# Patient Record
Sex: Female | Born: 1975
Health system: Southern US, Community
[De-identification: ages and names within clinical notes are randomized; demographics above are authoritative.]

## PROBLEM LIST (undated history)

## (undated) DIAGNOSIS — K2970 Gastritis, unspecified, without bleeding: Secondary | ICD-10-CM

## (undated) DIAGNOSIS — Z8619 Personal history of other infectious and parasitic diseases: Secondary | ICD-10-CM

## (undated) DIAGNOSIS — K219 Gastro-esophageal reflux disease without esophagitis: Secondary | ICD-10-CM

## (undated) DIAGNOSIS — B019 Varicella without complication: Secondary | ICD-10-CM

## (undated) DIAGNOSIS — E0590 Thyrotoxicosis, unspecified without thyrotoxic crisis or storm: Secondary | ICD-10-CM

## (undated) DIAGNOSIS — T7840XA Allergy, unspecified, initial encounter: Secondary | ICD-10-CM

## (undated) HISTORY — DX: Personal history of other infectious and parasitic diseases: Z86.19

## (undated) HISTORY — DX: Gastro-esophageal reflux disease without esophagitis: K21.9

## (undated) HISTORY — DX: Allergy, unspecified, initial encounter: T78.40XA

## (undated) HISTORY — DX: Varicella without complication: B01.9

## (undated) HISTORY — DX: Gastritis, unspecified, without bleeding: K29.70

## (undated) HISTORY — PX: COLONOSCOPY: SHX174

## (undated) HISTORY — DX: Thyrotoxicosis, unspecified without thyrotoxic crisis or storm: E05.90

## (undated) HISTORY — PX: UPPER GASTROINTESTINAL ENDOSCOPY: SHX188

---

## 2013-06-30 ENCOUNTER — Encounter: Payer: Self-pay | Admitting: Physician Assistant

## 2013-06-30 ENCOUNTER — Ambulatory Visit (INDEPENDENT_AMBULATORY_CARE_PROVIDER_SITE_OTHER): Payer: BC Managed Care – PPO | Admitting: Physician Assistant

## 2013-06-30 VITALS — BP 112/78 | HR 82 | Temp 98.4°F | Resp 14 | Ht 64.0 in | Wt 156.2 lb

## 2013-06-30 DIAGNOSIS — J209 Acute bronchitis, unspecified: Secondary | ICD-10-CM

## 2013-06-30 DIAGNOSIS — E0590 Thyrotoxicosis, unspecified without thyrotoxic crisis or storm: Secondary | ICD-10-CM

## 2013-06-30 MED ORDER — AZITHROMYCIN 250 MG PO TABS: ORAL_TABLET | ORAL | Status: DC

## 2013-06-30 NOTE — Progress Notes (Signed)
Patient ID: Kristina Powell, female   DOB: 17-Feb-1976, 37 y.o.   MRN: [id number]  Patient presents to clinic today to establish care.  Acute Concerns:   Patient c/o cough productive of clear-yellow sputum, chest congestion, subjective fevers, headache that have been present over the past week or so.  Patient endorses cousin with similar symptoms recently.  Denies sinus pressure or pain, but notes she did have significant nasal congestion and sore throat earlier in the week.  Took some OTC pain relievers with some relief of sore throat.  Has history of seasonal allergies.  Denies history of asthma.  Some mild shortness of breath after coughing spells.  Denies wheezing.  Denies rash, N/V/C/D.  Chronic Issues: (1) Hyperthyroidism -- Patient reports history of hyperthyroidism after giving birth to child 2 years ago.  Was told she has Grave's disease.  Was followed by endocrinologist in [place] before moving to [place].  Was followed by endocrinologist at [facility], but will not go back to see them due to 1000.00 fee for labs at last visit.  She feels her symptoms are gone.  Takes Methimazole 5 mg once a day, although she is prescribed medication 3 times a day.  Denies heart palpitations, diarrhea, weight loss, nervousness/anxiety, exophthalmos. Wants to know hoe much labs will cost.  Health Maintenance: Dental -- up-to-date Vision -- 2013.  States she needs to schedule an appointment Immunizations -- Up-to-date.  Declines flu shot due to bad experience in the past. Mammogram -- March 2014.  No abnormal findings. PAP -- reports up-to-date  Will attempt to obtain records from previous PCP and Endocrinologist.   Past Medical History  Diagnosis Date  . Hyperthyroidism     x2 years, post childbirth  . Chicken pox     No current outpatient prescriptions on file prior to visit.   No current facility-administered medications on file prior to visit.    No Known Allergies  Family History  Problem Relation  Age of Onset  . Hyperlipidemia Mother   . Diabetes Maternal Grandmother   . Thyroid disease Mother   . Skin cancer Mother   . Healthy Father   . Colon cancer Maternal Grandfather   . Thyroid disease Sister     History   Social History  . Marital Status: Married    Spouse Name: N/A    Number of Children: N/A  . Years of Education: N/A   Social History Main Topics  . Smoking status: Never Smoker   . Smokeless tobacco: Never Used  . Alcohol Use: No  . Drug Use: No  . Sexual Activity: Yes   Other Topics Concern  . None   Social History Narrative  . None   Review of Systems  Constitutional: Negative for fever, chills and weight loss.  HENT: Negative for hearing loss, ear pain, tinnitus and ear discharge.   Eyes: Negative for blurred vision, double vision, photophobia and pain.  Respiratory: Positive for cough, sputum production, shortness of breath and wheezing.   Cardiovascular: Negative for chest pain and palpitations.  Gastrointestinal: Negative for heartburn, nausea, vomiting, abdominal pain, diarrhea, constipation, blood in stool and melena.  Genitourinary: Negative for dysuria, urgency, frequency, hematuria and flank pain.  Musculoskeletal: Positive for myalgias.  Neurological: Negative for seizures, loss of consciousness and headaches.  Endo/Heme/Allergies: Positive for environmental allergies.  Psychiatric/Behavioral: Negative for depression, suicidal ideas, hallucinations and substance abuse. The patient is nervous/anxious. The patient does not have insomnia.    Filed Vitals:   06/30/13 1351  BP:  112/78  Pulse: 82  Temp: 98.4 F (36.9 C)  Resp: 14    Physical Exam  Vitals reviewed. Constitutional: She is oriented to person, place, and time and well-developed, well-nourished, and in no distress.  HENT:  Head: Normocephalic and atraumatic.  Right Ear: External ear normal.  Left Ear: External ear normal.  Nose: Nose normal.  Mouth/Throat: Oropharynx is  clear and moist. No oropharyngeal exudate.  TM WNL bilaterally.  No tenderness to percussion over sinuses.  Eyes: Conjunctivae and EOM are normal. Pupils are equal, round, and reactive to light.  Neck: Normal range of motion. Neck supple.  Cardiovascular: Normal rate, regular rhythm, normal heart sounds and intact distal pulses.   Pulmonary/Chest: Breath sounds normal. No respiratory distress. She has no wheezes. She has no rales. She exhibits no tenderness.  Abdominal: Soft. Bowel sounds are normal. She exhibits no distension and no mass. There is no tenderness. There is no rebound and no guarding.  Lymphadenopathy:    She has no cervical adenopathy.  Neurological: She is alert and oriented to person, place, and time.  Skin: Skin is warm and dry. No rash noted.   Assessment/Plan: Acute bronchitis Rx Azithromycin.  Increase fluids.  Rest.  Saline nasal spray.  Daily probiotic. Warm liquids for throat.  Call or return to clinic if symptoms do not improve.   Hyperthyroidism Will obtain TSH, free T4.  Obtain labs from prior endocrinologist and prior PCP.  Will possibly give referral to endocrinologist

## 2013-06-30 NOTE — Patient Instructions (Signed)
Please take antibiotics as prescribed.  Increase fluid intake.  Rest.  Daily probiotic.  Saline nasal spray.  Daily claritin.  Bronchitis Bronchitis is the body's way of reacting to injury and/or infection (inflammation) of the bronchi. Bronchi are the air tubes that extend from the windpipe into the lungs. If the inflammation becomes severe, it may cause shortness of breath. CAUSES  Inflammation may be caused by:  A virus.  Germs (bacteria).  Dust.  Allergens.  Pollutants and many other irritants. The cells lining the bronchial tree are covered with tiny hairs (cilia). These constantly beat upward, away from the lungs, toward the mouth. This keeps the lungs free of pollutants. When these cells become too irritated and are unable to do their job, mucus begins to develop. This causes the characteristic cough of bronchitis. The cough clears the lungs when the cilia are unable to do their job. Without either of these protective mechanisms, the mucus would settle in the lungs. Then you would develop pneumonia. Smoking is a common cause of bronchitis and can contribute to pneumonia. Stopping this habit is the single most important thing you can do to help yourself. TREATMENT   Your caregiver may prescribe an antibiotic if the cough is caused by bacteria. Also, medicines that open up your airways make it easier to breathe. Your caregiver may also recommend or prescribe an expectorant. It will loosen the mucus to be coughed up. Only take over-the-counter or prescription medicines for pain, discomfort, or fever as directed by your caregiver.  Removing whatever causes the problem (smoking, for example) is critical to preventing the problem from getting worse.  Cough suppressants may be prescribed for relief of cough symptoms.  Inhaled medicines may be prescribed to help with symptoms now and to help prevent problems from returning.  For those with recurrent (chronic) bronchitis, there may be a  need for steroid medicines. SEEK IMMEDIATE MEDICAL CARE IF:   During treatment, you develop more pus-like mucus (purulent sputum).  You have a fever.  Your baby is older than 3 months with a rectal temperature of 102 F (38.9 C) or higher.  Your baby is 40 months old or younger with a rectal temperature of 100.4 F (38 C) or higher.  You become progressively more ill.  You have increased difficulty breathing, wheezing, or shortness of breath. It is necessary to seek immediate medical care if you are elderly or sick from any other disease. MAKE SURE YOU:   Understand these instructions.  Will watch your condition.  Will get help right away if you are not doing well or get worse. Document Released: 09/16/2005 Document Revised: 12/09/2011 Document Reviewed: 07/26/2008 [facility] Patient Information 2014 [place], [place].

## 2013-06-30 NOTE — Assessment & Plan Note (Signed)
Will obtain TSH, free T4.  Obtain labs from prior endocrinologist and prior PCP.  Will possibly give referral to endocrinologist

## 2013-06-30 NOTE — Assessment & Plan Note (Signed)
Rx Azithromycin.  Increase fluids.  Rest.  Saline nasal spray.  Daily probiotic. Warm liquids for throat.  Call or return to clinic if symptoms do not improve.

## 2013-07-01 ENCOUNTER — Telehealth: Payer: Self-pay | Admitting: Physician Assistant

## 2013-07-01 ENCOUNTER — Encounter: Payer: Self-pay | Admitting: Physician Assistant

## 2013-07-01 NOTE — Telephone Encounter (Signed)
Letter written, printed and ready to be sent or picked up. 

## 2013-07-01 NOTE — Telephone Encounter (Signed)
Patient states that she was in the office yesterday and was given a note to go back to work today but she says that she is still not feeling well and called into work today. She would like a work note for today also.

## 2013-07-01 NOTE — Telephone Encounter (Signed)
Patient informed; she will p/u letter tomorrow morning at front desk after 8:00a/SLS

## 2013-07-14 ENCOUNTER — Ambulatory Visit (INDEPENDENT_AMBULATORY_CARE_PROVIDER_SITE_OTHER): Payer: BC Managed Care – PPO | Admitting: Physician Assistant

## 2013-07-14 ENCOUNTER — Encounter: Payer: Self-pay | Admitting: Physician Assistant

## 2013-07-14 VITALS — BP 110/78 | HR 78 | Temp 98.1°F | Resp 16 | Ht 64.0 in | Wt 152.0 lb

## 2013-07-14 DIAGNOSIS — R058 Other specified cough: Secondary | ICD-10-CM

## 2013-07-14 DIAGNOSIS — R059 Cough, unspecified: Secondary | ICD-10-CM

## 2013-07-14 DIAGNOSIS — R05 Cough: Secondary | ICD-10-CM

## 2013-07-14 NOTE — Progress Notes (Signed)
Patient ID: Kristina Powell, female   DOB: 1976/07/31, 37 y.o.   MRN: [id number]  Patient presents to clinic today c/o itchy, watery eyes, and dry cough noticed over the past week.  Patient was seen a couple of weeks ago and diagnosed with acute bronchitis.  States that since then cough has become non-productive.  Denies fever, chills, fatigue.  States she feels much better than at last visit.  Thinks she may have allergies.  Denies history of allergy before moving to [place].  States she took daily zyrtec per my directions while having bronchitis and noted a decrease in these potentially "allergy-related" symptoms.  Patient otherwise doing well today.   Past Medical History  Diagnosis Date  . Hyperthyroidism     x2 years, post childbirth  . Chicken pox     Current Outpatient Prescriptions on File Prior to Visit  Medication Sig Dispense Refill  . methimazole (TAPAZOLE) 5 MG tablet Take 5 mg by mouth 3 (three) times daily. PATIENT CURRENTLY ONLY TASKING ONCE DAILY D/T NOT HAVING LABS DRAWN AS OF YET.      . Multiple Vitamin (MULTIVITAMIN) tablet Take 1 tablet by mouth daily.       No current facility-administered medications on file prior to visit.    No Known Allergies  Family History  Problem Relation Age of Onset  . Hyperlipidemia Mother   . Diabetes Maternal Grandmother   . Thyroid disease Mother   . Skin cancer Mother   . Healthy Father   . Colon cancer Maternal Grandfather   . Thyroid disease Sister     History   Social History  . Marital Status: Married    Spouse Name: N/A    Number of Children: N/A  . Years of Education: N/A   Social History Main Topics  . Smoking status: Never Smoker   . Smokeless tobacco: Never Used  . Alcohol Use: No  . Drug Use: No  . Sexual Activity: Yes   Other Topics Concern  . None   Social History Narrative  . None   ROS See HPI.  All other ROS are negative.   Filed Vitals:   07/14/13 1325  BP: 110/78  Pulse: 78  Temp: 98.1  F (36.7 C)  Resp: 16   Physical Exam  Vitals reviewed. Constitutional: She is oriented to person, place, and time and well-developed, well-nourished, and in no distress.  HENT:  Head: Normocephalic and atraumatic.  Right Ear: External ear normal.  Left Ear: External ear normal.  Nose: Nose normal.  Mouth/Throat: Oropharynx is clear and moist. No oropharyngeal exudate.  TM WNL bilaterally.   Eyes: Conjunctivae are normal. Pupils are equal, round, and reactive to light.  Watery drainage noted from eyes bilaterally.  Allergic shiners noted as well.  Neck: Neck supple.  Cardiovascular: Normal rate, regular rhythm, normal heart sounds and intact distal pulses.   Pulmonary/Chest: Effort normal and breath sounds normal. No respiratory distress. She has no wheezes. She has no rales. She exhibits no tenderness.  Lymphadenopathy:    She has no cervical adenopathy.  Neurological: She is alert and oriented to person, place, and time.  Skin: Skin is warm and dry. No rash noted.  Psychiatric: Affect normal.   Assessment/Plan: Allergic cough Daily zyrtec.  Saline nasal spray.  Most likely cough is combination of allergies and healing airways from acute bronchitis.  Return to clinic if cough persists or new symptoms develop.

## 2013-07-14 NOTE — Assessment & Plan Note (Signed)
Daily zyrtec.  Saline nasal spray.  Most likely cough is combination of allergies and healing airways from acute bronchitis.  Return to clinic if cough persists or new symptoms develop.

## 2013-07-14 NOTE — Patient Instructions (Signed)
Increase fluids.  Daily probiotic.  Daily Claritin.  Visine Allergy Eye drops.  Honey or over the counter cough syrup for cough.   Allergies, Generic Allergies may happen from anything your body is sensitive to. This may be food, medicines, pollens, chemicals, and nearly anything around you in everyday life that produces allergens. An allergen is anything that causes an allergy producing substance. Heredity is often a factor in causing these problems. This means you may have some of the same allergies as your parents.  Seasonal allergies occur in all age groups. These are seasonal because they usually occur during the same season every year. They may be a reaction to molds, grass pollens, or tree pollens. Other causes of problems are house dust mite allergens, pet dander, and mold spores. The symptoms often consist of nasal congestion, a runny itchy nose associated with sneezing, and tearing itchy eyes. There is often an associated itching of the mouth and ears. The problems happen when you come in contact with pollens and other allergens. Allergens are the particles in the air that the body reacts to with an allergic reaction. This causes you to release allergic antibodies. Through a chain of events, these eventually cause you to release histamine into the blood stream. Although it is meant to be protective to the body, it is this release that causes your discomfort. This is why you were given anti-histamines to feel better. If you are unable to pinpoint the offending allergen, it may be determined by skin or blood testing. Allergies cannot be cured but can be controlled with medicine.  Hay fever is a collection of all or some of the seasonal allergy problems. It may often be treated with simple over-the-counter medicine such as diphenhydramine. Take medicine as directed. Do not drink alcohol or drive while taking this medicine. Check with your caregiver or package insert for child dosages. If these  medicines are not effective, there are many new medicines your caregiver can prescribe. Stronger medicine such as nasal spray, eye drops, and corticosteroids may be used if the first things you try do not work well. Other treatments such as immunotherapy or desensitizing injections can be used if all else fails. Follow up with your caregiver if problems continue. These seasonal allergies are usually not life threatening. They are generally more of a nuisance that can often be handled using medicine. HOME CARE INSTRUCTIONS   If unsure what causes a reaction, keep a diary of foods eaten and symptoms that follow. Avoid foods that cause reactions.  If hives or rash are present:  Take medicine as directed.  You may use an over-the-counter antihistamine (diphenhydramine) for hives and itching as needed.  Apply cold compresses (cloths) to the skin or take baths in cool water. Avoid hot baths or showers. Heat will make a rash and itching worse.  If you are severely allergic:  Following a treatment for a severe reaction, hospitalization is often required for closer follow-up.  Wear a medic-alert bracelet or necklace stating the allergy.  You and your family must learn how to give adrenaline or use an anaphylaxis kit.  If you have had a severe reaction, always carry your anaphylaxis kit or EpiPen with you. Use this medicine as directed by your caregiver if a severe reaction is occurring. Failure to do so could have a fatal outcome. SEEK MEDICAL CARE IF:  You suspect a food allergy. Symptoms generally happen within 30 minutes of eating a food.  Your symptoms have not gone away within  2 days or are getting worse.  You develop new symptoms.  You want to retest yourself or your child with a food or drink you think causes an allergic reaction. Never do this if an anaphylactic reaction to that food or drink has happened before. Only do this under the care of a caregiver. SEEK IMMEDIATE MEDICAL CARE  IF:   You have difficulty breathing, are wheezing, or have a tight feeling in your chest or throat.  You have a swollen mouth, or you have hives, swelling, or itching all over your body.  You have had a severe reaction that has responded to your anaphylaxis kit or an EpiPen. These reactions may return when the medicine has worn off. These reactions should be considered life threatening. MAKE SURE YOU:   Understand these instructions.  Will watch your condition.  Will get help right away if you are not doing well or get worse. Document Released: 12/10/2002 Document Revised: 12/09/2011 Document Reviewed: 05/16/2008 [facility] Patient Information 2014 [place], [place].

## 2013-07-18 LAB — TSH: TSH: 1.075 u[IU]/mL (ref 0.350–4.500)

## 2013-07-21 ENCOUNTER — Telehealth: Payer: Self-pay | Admitting: *Deleted

## 2013-07-21 DIAGNOSIS — E0590 Thyrotoxicosis, unspecified without thyrotoxic crisis or storm: Secondary | ICD-10-CM

## 2013-07-21 NOTE — Telephone Encounter (Signed)
Patient informed, understood & agreed; request to have labs done at [facility] [on a Saturday] on [address] [place]; faxed orders/SLS

## 2013-07-21 NOTE — Telephone Encounter (Signed)
Message copied by Regis Bill on Wed Jul 21, 2013  3:22 PM ------      Message from: Marcelline Mates      Created: Wed Jul 21, 2013  7:46 AM       Please inform patient that her Thyroid levels are in normal limits.  What I would like to do is for her to stop her methimazole completely for the next 2-3 weeks.  I want her to come back in that time for [place] to repeat her thyroid levels to make sure they stay normal after stopping treatment and for [place] to discuss her thyroid condition. ------

## 2013-07-28 ENCOUNTER — Telehealth: Payer: Self-pay | Admitting: Physician Assistant

## 2013-07-28 DIAGNOSIS — J392 Other diseases of pharynx: Secondary | ICD-10-CM

## 2013-07-28 NOTE — Telephone Encounter (Signed)
Patient informed, understood & agreed; pt erased VM for appt, gave her all information on appointment for [facility] ENT w/Dr. Kathleen Lime

## 2013-07-28 NOTE — Telephone Encounter (Signed)
Patient states that she is still having problems with her throat. She says that she has been in twice for this and would like to be referred out. She says that she is not having any of the other symptoms as before.

## 2013-07-28 NOTE — Telephone Encounter (Signed)
Please Advise

## 2013-07-28 NOTE — Telephone Encounter (Signed)
Referral to ENT has been made.  Patient will be contacted by ENT office for further evaluation and treatment.

## 2013-08-28 LAB — TSH: TSH: 0.682 u[IU]/mL (ref 0.350–4.500)

## 2013-08-28 LAB — T4, FREE: Free T4: 1.41 ng/dL (ref 0.80–1.80)

## 2013-10-20 ENCOUNTER — Telehealth: Payer: Self-pay | Admitting: *Deleted

## 2013-10-20 DIAGNOSIS — E0590 Thyrotoxicosis, unspecified without thyrotoxic crisis or storm: Secondary | ICD-10-CM

## 2013-10-20 NOTE — Telephone Encounter (Signed)
Pt left message on 10/19/13 that she was supposed to return to the lab for TSH but the lab didn't have an order. Left message for pt to return my call.  Did pt already have blood drawn? Will she be coming to our office for labs?

## 2013-10-20 NOTE — Telephone Encounter (Signed)
Spoke with pt and verified that [facility] [address] Saturday for her follow up TSH. Order entered and faxed to [phone].

## 2013-11-06 LAB — TSH: TSH: 0.438 u[IU]/mL (ref 0.350–4.500)

## 2014-08-03 ENCOUNTER — Encounter: Payer: Self-pay | Admitting: Physician Assistant

## 2014-08-03 ENCOUNTER — Ambulatory Visit (INDEPENDENT_AMBULATORY_CARE_PROVIDER_SITE_OTHER): Payer: BC Managed Care – PPO | Admitting: Physician Assistant

## 2014-08-03 ENCOUNTER — Ambulatory Visit (HOSPITAL_BASED_OUTPATIENT_CLINIC_OR_DEPARTMENT_OTHER)
Admission: RE | Admit: 2014-08-03 | Discharge: 2014-08-03 | Disposition: A | Discharge status: Home / Self Care | Payer: BC Managed Care – PPO | Source: Ambulatory Visit | Attending: Physician Assistant | Admitting: Physician Assistant

## 2014-08-03 VITALS — BP 118/80 | HR 96 | Temp 98.3°F | Wt 151.0 lb

## 2014-08-03 DIAGNOSIS — M2660 Temporomandibular joint disorder, unspecified: Secondary | ICD-10-CM | POA: Insufficient documentation

## 2014-08-03 DIAGNOSIS — M26609 Unspecified temporomandibular joint disorder, unspecified side: Secondary | ICD-10-CM

## 2014-08-03 DIAGNOSIS — E0590 Thyrotoxicosis, unspecified without thyrotoxic crisis or storm: Secondary | ICD-10-CM

## 2014-08-03 MED ORDER — MELOXICAM 15 MG PO TABS: 15.0000 mg | ORAL_TABLET | Freq: Every day | ORAL | Status: DC

## 2014-08-03 NOTE — Patient Instructions (Signed)
Please take the Mobic daily for the TMJ pain.  Apply topical Aspercreme to the area.  Go downstairs for x-ray so we can further assess your symptoms.  I will call you with your results.  Read information below on TMJ dysfunction.  We will recheck your thyroid function today as well.  Stop by the lab for this.  Follow-up will be determined based on your results.  Temporomandibular Problems  Temporomandibular joint (TMJ) dysfunction means there are problems with the joint between your jaw and your skull. This is a joint lined by cartilage like other joints in your body but also has a small disc in the joint which keeps the bones from rubbing on each other. These joints are like other joints and can get inflamed (sore) from arthritis and other problems. When this joint gets sore, it can cause headaches and pain in the jaw and the face. CAUSES  Usually the arthritic types of problems are caused by soreness in the joint. Soreness in the joint can also be caused by overuse. This may come from grinding your teeth. It may also come from mis-alignment in the joint. DIAGNOSIS Diagnosis of this condition can often be made by history and exam. Sometimes your caregiver may need X-rays or an MRI scan to determine the exact cause. It may be necessary to see your dentist to determine if your teeth and jaws are lined up correctly. TREATMENT  Most of the time this problem is not serious; however, sometimes it can persist (become chronic). When this happens medications that will cut down on inflammation (soreness) help. Sometimes a shot of cortisone into the joint will be helpful. If your teeth are not aligned it may help for your dentist to make a splint for your mouth that can help this problem. If no physical problems can be found, the problem may come from tension. If tension is found to be the cause, biofeedback or relaxation techniques may be helpful. HOME CARE INSTRUCTIONS   Later in the day, applications of ice  packs may be helpful. Ice can be used in a plastic bag with a towel around it to prevent frostbite to skin. This may be used about every 2 hours for 20 to 30 minutes, as needed while awake, or as directed by your caregiver.  Only take over-the-counter or prescription medicines for pain, discomfort, or fever as directed by your caregiver.  If physical therapy was prescribed, follow your caregiver's directions.  Wear mouth appliances as directed if they were given. Document Released: 06/11/2001 Document Revised: 12/09/2011 Document Reviewed: 09/18/2008 [facility] Patient Information 2015 [place], [place]. This information is not intended to replace advice given to you by your health care provider. Make sure you discuss any questions you have with your health care provider.

## 2014-08-03 NOTE — Assessment & Plan Note (Signed)
Rx Mobic.  Ice.  Topical Aspercreme.  Will proceed with plain film of TMJ joints bilaterally.

## 2014-08-03 NOTE — Progress Notes (Signed)
Pre visit review using our clinic review tool, if applicable. No additional management support is needed unless otherwise documented below in the visit note. 

## 2014-08-03 NOTE — Assessment & Plan Note (Signed)
Will recheck thyroid function.  If stable, will recheck annually.

## 2014-08-03 NOTE — Progress Notes (Signed)
   Patient presents to clinic today c/o R Pain in anterior ear and jaw x 4 weeks.  Was seen by an ENT recently who diagnosed her with TMJ.  Was told to take Ibuprofen.  Denies good relief of symptoms with this medication.  Endorses continues pain with ROM of the mouth.  Denies fever, chills.  Patient wishes to have her TSH checked to document continued stability off of methimazole.  Past Medical History  Diagnosis Date  . Hyperthyroidism     x2 years, post childbirth  . Chicken pox     Current Outpatient Prescriptions on File Prior to Visit  Medication Sig Dispense Refill  . Multiple Vitamin (MULTIVITAMIN) tablet Take 1 tablet by mouth daily.     No current facility-administered medications on file prior to visit.    No Known Allergies  Family History  Problem Relation Age of Onset  . Hyperlipidemia Mother   . Diabetes Maternal Grandmother   . Thyroid disease Mother   . Skin cancer Mother   . Healthy Father   . Colon cancer Maternal Grandfather   . Thyroid disease Sister     History   Social History  . Marital Status: Married    Spouse Name: N/A    Number of Children: N/A  . Years of Education: N/A   Social History Main Topics  . Smoking status: Never Smoker   . Smokeless tobacco: Never Used  . Alcohol Use: No  . Drug Use: No  . Sexual Activity: Yes   Other Topics Concern  . None   Social History Narrative   Review of Systems - See HPI.  All other ROS are negative.  BP 118/80 mmHg  Pulse 96  Temp(Src) 98.3 F (36.8 C)  Wt 151 lb (68.493 kg)  SpO2 98%  Physical Exam  Constitutional: She is oriented to person, place, and time and well-developed, well-nourished, and in no distress.  HENT:  Head: Normocephalic and atraumatic.  Right Ear: Tympanic membrane, external ear and ear canal normal.  Left Ear: Tympanic membrane, external ear and ear canal normal.  Nose: Nose normal.  Mouth/Throat: Uvula is midline, oropharynx is clear and moist and mucous  membranes are normal.  Pain with movement of R TMJ noted on examination.  Eyes: Conjunctivae are normal. Pupils are equal, round, and reactive to light.  Neck: Neck supple.  Cardiovascular: Normal rate, regular rhythm, normal heart sounds and intact distal pulses.   Pulmonary/Chest: Effort normal and breath sounds normal. No respiratory distress. She has no wheezes. She has no rales. She exhibits no tenderness.  Lymphadenopathy:    She has no cervical adenopathy.  Neurological: She is alert and oriented to person, place, and time.  Skin: Skin is warm and dry. No rash noted.  Psychiatric: Affect normal.  Vitals reviewed.  Assessment/Plan: Hyperthyroidism Will recheck thyroid function.  If stable, will recheck annually.  TMJ dysfunction Rx Mobic.  Ice.  Topical Aspercreme.  Will proceed with plain film of TMJ joints bilaterally.

## 2014-08-04 LAB — TSH: TSH: 0.12 u[IU]/mL — ABNORMAL LOW (ref 0.35–4.50)

## 2014-08-04 LAB — T4, FREE: Free T4: 1.95 ng/dL — ABNORMAL HIGH (ref 0.60–1.60)

## 2014-08-09 ENCOUNTER — Telehealth: Payer: Self-pay | Admitting: Physician Assistant

## 2014-08-09 DIAGNOSIS — E0590 Thyrotoxicosis, unspecified without thyrotoxic crisis or storm: Secondary | ICD-10-CM

## 2014-08-09 NOTE — Telephone Encounter (Signed)
Referral has been placed. 

## 2014-08-09 NOTE — Telephone Encounter (Signed)
-----   Message from Eustace Quailavid J Reabold, RMA sent at 08/09/2014  3:23 PM EST ----- Pt verbalized understanding . Pt would like to see a specialist in [place].

## 2014-08-19 ENCOUNTER — Ambulatory Visit (INDEPENDENT_AMBULATORY_CARE_PROVIDER_SITE_OTHER): Payer: BC Managed Care – PPO | Admitting: Internal Medicine

## 2014-08-19 ENCOUNTER — Encounter: Payer: Self-pay | Admitting: Internal Medicine

## 2014-08-19 VITALS — BP 100/64 | HR 91 | Temp 97.7°F | Resp 12 | Ht 63.75 in | Wt 151.8 lb

## 2014-08-19 DIAGNOSIS — E0500 Thyrotoxicosis with diffuse goiter without thyrotoxic crisis or storm: Secondary | ICD-10-CM

## 2014-08-19 MED ORDER — METHIMAZOLE 5 MG PO TABS: 5.0000 mg | ORAL_TABLET | Freq: Two times a day (BID) | ORAL | Status: DC

## 2014-08-19 NOTE — Progress Notes (Signed)
Patient ID: Kristina Powell Amey, female   DOB: 09/03/1976, 38 y.o.   MRN: [id number]   HPI  Kristina Powell Cain is a 38 y.o.-year-old female, referred by her PCP, Piedad ClimesMartin, William Cody, PA-C, for evaluation for thyrotoxycosis, and h/o Graves ds.  Pt was dx with hyperthyroidism in 2005 >> developed this after she had her child >> saw PCP in [place], where she lived at the time >> dx with Graves ds. (unclear if she had an uptake and scan then, but likely) >> tx with MMI from 2012-2014 >> stopped 06/2013.   I reviewed pt's thyroid tests: Lab Results  Component Value Date   TSH 0.12* 08/03/2014   TSH 0.438 11/06/2013   TSH 0.682 08/28/2013   TSH 1.075 07/17/2013   FREET4 1.95* 08/03/2014   FREET4 1.41 08/28/2013   FREET4 1.47 07/17/2013  11/24/2013: TSH 0.86, fT4 1.4, TSI 68 (<140%) 07/02/2012: TSH 3.18, TT4 9.4 (4.5-12) 04/14/2012: TSH 1.81  She restarted MMI this week after last results returned >> 10 mg daily. She became constipated after this. Still not sleeping well. She is afraid to come off MMI.  Pt denies feeling nodules in neck, hoarseness, dysphagia/odynophagia, SOB with lying down; she c/o: - + excessive sweating/+ heat intolerance - + fatigue - + poor sleep 2/2 heat intolerance - no tremors - + anxiety - + palpitations - no problems with concentration - + itching - no hyperdefecation - no weight loss/gain  Pt does have a FH of thyroid ds.: mother and sister. No FH of thyroid cancer. No h/o radiation tx to head or neck.  No seaweed or kelp, no recent contrast studies. No steroid use. No herbal supplements.   She sees ophthalmology (Dr. Burgess Estelleanner) - next appt 10/17/2014. Has eye drops. Has blurry vision.  She has a lot of stress at work. She did not exercise at all this month.   ROS: Constitutional: see HPI Eyes: + blurry vision, no xerophthalmia ENT: + sore throat, no nodules palpated in throat, no dysphagia/odynophagia, no hoarseness Cardiovascular: no CP/+ SOB/+ palpitations/leg  swelling Respiratory: no cough/+ SOB Gastrointestinal: no N/V/D/C/+ acid reflux Musculoskeletal: + muscle/no joint aches Skin: no rashes Neurological: no tremors/numbness/tingling/dizziness Psychiatric: no depression/anxiety  Past Medical History  Diagnosis Date  . Hyperthyroidism     x2 years, post childbirth  . Chicken pox    Past Surgical History  Procedure Laterality Date  . Unremarkable     History   Social History  . Marital Status: Married    Spouse Name: N/A    Number of Children: 1   Occupational History  . Credit analyst   Social History Main Topics  . Smoking status: Never Smoker   . Smokeless tobacco: No  . Alcohol Use: No  . Drug Use: No   Social History Narrative   Current Outpatient Prescriptions on File Prior to Visit  Medication Sig Dispense Refill  . Multiple Vitamin (MULTIVITAMIN) tablet Take 1 tablet by mouth daily.    Marland Kitchen. ibuprofen (ADVIL,MOTRIN) 800 MG tablet Take 800 mg by mouth every 8 (eight) hours as needed.    . meloxicam (MOBIC) 15 MG tablet Take 1 tablet (15 mg total) by mouth daily. (Patient taking differently: Take 15 mg by mouth as needed. ) 30 tablet 0   No current facility-administered medications on file prior to visit.   No Known Allergies Family History  Problem Relation Age of Onset  . Hyperlipidemia Mother   . Diabetes Maternal Grandmother   . Thyroid disease Mother   . Skin  cancer Mother   . Healthy Father   . Colon cancer Maternal Grandfather   . Thyroid disease Sister    PE: BP 100/64 mmHg  Pulse 91  Temp(Src) 97.7 F (36.5 C) (Oral)  Resp 12  Ht 5' 3.75" (1.619 m)  Wt 151 lb 12.8 oz (68.856 kg)  BMI 26.27 kg/m2  SpO2 98%  LMP 08/05/2014 Wt Readings from Last 3 Encounters:  08/19/14 151 lb 12.8 oz (68.856 kg)  08/03/14 151 lb (68.493 kg)  07/14/13 152 lb (68.947 kg)   Constitutional: slightly overweight, in NAD Eyes: PERRLA, EOMI, no exophthalmos, no lid lag, no stare ENT: moist mucous membranes, no  thyromegaly, no thyroid bruits, no cervical lymphadenopathy Cardiovascular: RRR, No MRG Respiratory: CTA B Gastrointestinal: abdomen soft, NT, ND, BS+ Musculoskeletal: no deformities, strength intact in all 4 Skin: moist, warm, no rashes Neurological: no tremor with outstretched hands, DTR +3/5 in  all 4  ASSESSMENT: 1. Graves disease  PLAN:  1. Patient with h/o Graves ds, initially resolved after tx with MMI, now with a newly found low TSH + fT4, with some thyrotoxic sxs: heat intolerance, palpitations, anxiety.  - she does not appear to have exogenous causes for the low TSH.  - I suggested that we check the TSH, fT3 and fT4 and also had thyroid stimulating antibodies to screen for Graves' disease. However, since she restarted the MMI that she had at home for the last week, I will wait for her to finish a month on this before repeating the labs. Since she is afraid to stop the MMI as she feels poorly, we can check the tests in a month and then try to taper the MMI to off at that time, if possible. We may need an uptake and scan if TSH still low then. - we discussed about possible modalities of treatment for the above conditions, to include methimazole use, radioactive iodine ablation or surgery. She would like to continue the MMI. - She was advised to stop the Methimazole (Tapazole) and call us or your primary care doctor if you develop: - sore throat - fever - yellow skin - dark urine - light colored stools - I advised her not to get pregnant until TFTs are normal.  - I do not feel that we need to add beta blockers at this time, since she is not tachycardic or tremulous - RTC in 3 months, but likely sooner for repeat labs Patient Instructions  Please change the Methimazole to 5 mg 2x a day with meals. Come back in 1 month for labs. I will send you the results through MyChart. We will see then if we need a Thyroid Uptake and Scan. Please come back for a follow-up appointment in 3  months.  - time spent with the patient: 45 min, of which >50% was spent in obtaining information about her symptoms, reviewing her previous labs, evaluations, and treatments, counseling her about her condition (please see the discussed topics above), and developing a plan to further investigate it. She had a number of questions which I addressed.

## 2014-08-19 NOTE — Patient Instructions (Signed)
Please change the Methimazole to 5 mg 2x a day with meals. Come back in 1 month for labs. I will send you the results through MyChart. We will see then if we need a Thyroid Uptake and Scan. Please come back for a follow-up appointment in 3 months.

## 2014-09-19 ENCOUNTER — Other Ambulatory Visit (INDEPENDENT_AMBULATORY_CARE_PROVIDER_SITE_OTHER): Payer: BC Managed Care – PPO

## 2014-09-19 ENCOUNTER — Other Ambulatory Visit: Payer: BC Managed Care – PPO

## 2014-09-19 DIAGNOSIS — E0500 Thyrotoxicosis with diffuse goiter without thyrotoxic crisis or storm: Secondary | ICD-10-CM

## 2014-09-20 LAB — T4, FREE: Free T4: 1.99 ng/dL — ABNORMAL HIGH (ref 0.60–1.60)

## 2014-09-20 LAB — TSH: TSH: 0.05 u[IU]/mL — ABNORMAL LOW (ref 0.35–4.50)

## 2014-09-20 LAB — T3, FREE: T3, Free: 4.8 pg/mL — ABNORMAL HIGH (ref 2.3–4.2)

## 2014-09-22 LAB — THYROID STIMULATING IMMUNOGLOBULIN: TSI: 219 % baseline — ABNORMAL HIGH (ref <140)

## 2014-09-25 ENCOUNTER — Encounter (HOSPITAL_COMMUNITY): Payer: Self-pay | Admitting: Emergency Medicine

## 2014-09-25 ENCOUNTER — Emergency Department (HOSPITAL_COMMUNITY)
Admission: EM | Admit: 2014-09-25 | Discharge: 2014-09-25 | Disposition: A | Discharge status: Home / Self Care | Payer: BC Managed Care – PPO | Source: Home / Self Care | Attending: Family Medicine | Admitting: Family Medicine

## 2014-09-25 DIAGNOSIS — N390 Urinary tract infection, site not specified: Secondary | ICD-10-CM

## 2014-09-25 LAB — POCT URINALYSIS DIP (DEVICE)
Bilirubin Urine: NEGATIVE
Glucose, UA: NEGATIVE mg/dL
Ketones, ur: NEGATIVE mg/dL
Nitrite: NEGATIVE
PROTEIN: NEGATIVE mg/dL
SPECIFIC GRAVITY, URINE: 1.02 (ref 1.005–1.030)
UROBILINOGEN UA: 0.2 mg/dL (ref 0.0–1.0)
pH: 5.5 (ref 5.0–8.0)

## 2014-09-25 LAB — POCT PREGNANCY, URINE: Preg Test, Ur: NEGATIVE

## 2014-09-25 MED ORDER — CEPHALEXIN 500 MG PO CAPS: 500.0000 mg | ORAL_CAPSULE | Freq: Four times a day (QID) | ORAL | Status: DC

## 2014-09-25 NOTE — ED Provider Notes (Signed)
CSN: [id number]     Arrival date & time 09/25/14  1351 History   First MD Initiated Contact with Patient 09/25/14 1446     Chief Complaint  Patient presents with  . Urinary Tract Infection   (Consider location/radiation/quality/duration/timing/severity/associated sxs/prior Treatment) HPI Comments: 38 year old female complaining of left lower quadrant and suprapubic pain and pain in the lower most midback since last night. Is accompanied by urinary frequency and minor dysuria. She did have nausea this morning but no vomiting.   Past Medical History  Diagnosis Date  . Hyperthyroidism     x2 years, post childbirth  . Chicken pox    Past Surgical History  Procedure Laterality Date  . Unremarkable     Family History  Problem Relation Age of Onset  . Hyperlipidemia Mother   . Diabetes Maternal Grandmother   . Thyroid disease Mother   . Skin cancer Mother   . Healthy Father   . Colon cancer Maternal Grandfather   . Thyroid disease Sister    History  Substance Use Topics  . Smoking status: Never Smoker   . Smokeless tobacco: Never Used  . Alcohol Use: No   OB History    No data available     Review of Systems  Constitutional: Negative.   Genitourinary: Positive for dysuria, frequency and flank pain. Negative for decreased urine volume, vaginal bleeding, vaginal discharge, vaginal pain and menstrual problem.       Suprapubic pain    Allergies  Review of patient's allergies indicates no known allergies.  Home Medications   Prior to Admission medications   Medication Sig Start Date End Date Taking? Authorizing Provider  cephALEXin (KEFLEX) 500 MG capsule Take 1 capsule (500 mg total) by mouth 4 (four) times daily. 09/25/14   Hayden Rasmussenavid Joy Haegele, NP  ibuprofen (ADVIL,MOTRIN) 800 MG tablet Take 800 mg by mouth every 8 (eight) hours as needed.    Historical Provider, MD  meloxicam (MOBIC) 15 MG tablet Take 1 tablet (15 mg total) by mouth daily. Patient taking differently: Take 15  mg by mouth as needed.  08/03/14   Waldon MerlWilliam C Martin, PA-C  methimazole (TAPAZOLE) 5 MG tablet Take 1 tablet (5 mg total) by mouth 2 (two) times daily. 08/19/14   Carlus Pavlovristina Gherghe, MD  Multiple Vitamin (MULTIVITAMIN) tablet Take 1 tablet by mouth daily.    Historical Provider, MD   BP 126/76 mmHg  Pulse 92  Temp(Src) 98.4 F (36.9 C) (Oral)  Resp 16  SpO2 100%  LMP 09/04/2014 Physical Exam  Constitutional: She is oriented to person, place, and time.  Neck: Normal range of motion. Neck supple.  Cardiovascular: Normal rate, regular rhythm and normal heart sounds.   Pulmonary/Chest: Effort normal.  Abdominal: She exhibits no distension.  Tenderness over the suprapubic area.  Neurological: She is alert and oriented to person, place, and time.  Skin: Skin is warm and dry.  Psychiatric: She has a normal mood and affect.  Vitals reviewed.   ED Course  Procedures (including critical care time) Labs Review Labs Reviewed  POCT URINALYSIS DIP (DEVICE) - Abnormal; Notable for the following:    Hgb urine dipstick TRACE (*)    Leukocytes, UA TRACE (*)    All other components within normal limits  URINE CULTURE  POCT PREGNANCY, URINE   Results for orders placed or performed during the hospital encounter of 09/25/14  POCT urinalysis dip (device)  Result Value Ref Range   Glucose, UA NEGATIVE NEGATIVE mg/dL   Bilirubin Urine NEGATIVE NEGATIVE  Ketones, ur NEGATIVE NEGATIVE mg/dL   Specific Gravity, Urine 1.020 1.005 - 1.030   Hgb urine dipstick TRACE (A) NEGATIVE   pH 5.5 5.0 - 8.0   Protein, ur NEGATIVE NEGATIVE mg/dL   Urobilinogen, UA 0.2 0.0 - 1.0 mg/dL   Nitrite NEGATIVE NEGATIVE   Leukocytes, UA TRACE (A) NEGATIVE  Pregnancy, urine POC  Result Value Ref Range   Preg Test, Ur NEGATIVE NEGATIVE     Imaging Review No results found.   MDM   1. UTI (lower urinary tract infection)    Lots of water AZO standard for urinary symptoms Keflex qid Urine  culture      Hayden Rasmussenavid Ashtyn Meland, NP 09/25/14 1503

## 2014-09-25 NOTE — ED Notes (Signed)
Pain in left lower abdomen and into the back, left.  Burning with urination.  Increase in having to urinate.

## 2014-09-25 NOTE — Discharge Instructions (Signed)
Urinary Tract Infection Lots of water AZO standard for urinary symptoms  Urinary tract infections (UTIs) can develop anywhere along your urinary tract. Your urinary tract is your body's drainage system for removing wastes and extra water. Your urinary tract includes two kidneys, two ureters, a bladder, and a urethra. Your kidneys are a pair of bean-shaped organs. Each kidney is about the size of your fist. They are located below your ribs, one on each side of your spine. CAUSES Infections are caused by microbes, which are microscopic organisms, including fungi, viruses, and bacteria. These organisms are so small that they can only be seen through a microscope. Bacteria are the microbes that most commonly cause UTIs. SYMPTOMS  Symptoms of UTIs may vary by age and gender of the patient and by the location of the infection. Symptoms in young women typically include a frequent and intense urge to urinate and a painful, burning feeling in the bladder or urethra during urination. Older women and men are more likely to be tired, shaky, and weak and have muscle aches and abdominal pain. A fever may mean the infection is in your kidneys. Other symptoms of a kidney infection include pain in your back or sides below the ribs, nausea, and vomiting. DIAGNOSIS To diagnose a UTI, your caregiver will ask you about your symptoms. Your caregiver also will ask to provide a urine sample. The urine sample will be tested for bacteria and white blood cells. White blood cells are made by your body to help fight infection. TREATMENT  Typically, UTIs can be treated with medication. Because most UTIs are caused by a bacterial infection, they usually can be treated with the use of antibiotics. The choice of antibiotic and length of treatment depend on your symptoms and the type of bacteria causing your infection. HOME CARE INSTRUCTIONS  If you were prescribed antibiotics, take them exactly as your caregiver instructs you. Finish  the medication even if you feel better after you have only taken some of the medication.  Drink enough water and fluids to keep your urine clear or pale yellow.  Avoid caffeine, tea, and carbonated beverages. They tend to irritate your bladder.  Empty your bladder often. Avoid holding urine for long periods of time.  Empty your bladder before and after sexual intercourse.  After a bowel movement, women should cleanse from front to back. Use each tissue only once. SEEK MEDICAL CARE IF:   You have back pain.  You develop a fever.  Your symptoms do not begin to resolve within 3 days. SEEK IMMEDIATE MEDICAL CARE IF:   You have severe back pain or lower abdominal pain.  You develop chills.  You have nausea or vomiting.  You have continued burning or discomfort with urination. MAKE SURE YOU:   Understand these instructions.  Will watch your condition.  Will get help right away if you are not doing well or get worse. Document Released: 06/26/2005 Document Revised: 03/17/2012 Document Reviewed: 10/25/2011 [facility] Patient Information 2015 [employer] [place]. This information is not intended to replace advice given to you by your health care provider. Make sure you discuss any questions you have with your health care provider.

## 2014-09-27 LAB — URINE CULTURE: Special Requests: NORMAL

## 2014-09-28 NOTE — ED Notes (Signed)
Urine culture: 40,000 colonies Enterococcus species.  Pt. treated with Keflex-not on sensitivity.  Message sent to Hayden Rasmussenavid Mabe NP. 09/28/2014

## 2014-09-29 ENCOUNTER — Telehealth (HOSPITAL_COMMUNITY): Payer: Self-pay | Admitting: *Deleted

## 2014-09-29 NOTE — ED Notes (Addendum)
Urine culture: 40,000 colonies Enterococcus.  Treated with Keflex.  12/30  Message sent to Hayden Rasmussenavid Mabe NP.  He said called pt., if not better then change her to Ampicillin. I called pt. and left a message to call.  Call 1. 09/29/2014 Left message.  Call 2. 10/04/2014 Pt. called back.  Her UTI symptoms are gone but she has a little discharge, burning and itching. I told her that she may have developed a yeast infection from the antibiotics and to try OTC Monistat. If that does not help to get rechecked with PCP or come back here.  Pt. voiced understanding. Vassie MoselleYork, Robet Crutchfield M 10/04/2014

## 2014-10-05 ENCOUNTER — Telehealth (HOSPITAL_COMMUNITY): Payer: Self-pay | Admitting: *Deleted

## 2014-10-05 MED ORDER — AMPICILLIN 500 MG PO CAPS: 500.0000 mg | ORAL_CAPSULE | Freq: Four times a day (QID) | ORAL | Status: DC

## 2014-10-05 NOTE — ED Notes (Signed)
Pt. called back and said she is having her urinary symptoms again with back pain.  I told her I would ask about getting the Rx. of Ampicillin sent to her pharmacy.  Discussed with Dr. Denyse Amassorey about original plan.  He e-prescribed the Ampicillin to her pharmacy. Vassie MoselleYork, Olie Scaffidi M 10/05/2014

## 2014-10-05 NOTE — ED Notes (Signed)
UCX pos for enterococs.  Call in Ampicillin  Rodolph BongEvan S Athalene Kolle, MD 10/05/14 [phone]

## 2014-10-07 ENCOUNTER — Telehealth: Payer: Self-pay | Admitting: Internal Medicine

## 2014-10-07 MED ORDER — METHIMAZOLE 5 MG PO TABS: 5.0000 mg | ORAL_TABLET | Freq: Two times a day (BID) | ORAL | Status: DC

## 2014-10-07 NOTE — Telephone Encounter (Signed)
Patient prescription for Methimazole 5 mg [facility] [address]

## 2014-10-07 NOTE — Telephone Encounter (Signed)
Rx refill sent. Please read note below.

## 2014-10-07 NOTE — Telephone Encounter (Signed)
OK. Noted. No problem.

## 2014-10-07 NOTE — Telephone Encounter (Signed)
Pt needs updated Methimazole RX 10 mg 2 times daily. She does not want to do RAI right now.

## 2014-10-12 ENCOUNTER — Telehealth: Payer: Self-pay | Admitting: Internal Medicine

## 2014-10-12 ENCOUNTER — Other Ambulatory Visit: Payer: Self-pay | Admitting: *Deleted

## 2014-10-12 MED ORDER — METHIMAZOLE 10 MG PO TABS: 10.0000 mg | ORAL_TABLET | Freq: Two times a day (BID) | ORAL | Status: DC

## 2014-10-12 NOTE — Telephone Encounter (Signed)
Done

## 2014-10-12 NOTE — Telephone Encounter (Signed)
The my chart message states she is to double the meds. She is running out and needs a new rx with the updated dosage to [facility]

## 2014-11-22 ENCOUNTER — Ambulatory Visit: Payer: BC Managed Care – PPO | Admitting: Internal Medicine

## 2014-12-21 ENCOUNTER — Ambulatory Visit (INDEPENDENT_AMBULATORY_CARE_PROVIDER_SITE_OTHER): Payer: BLUE CROSS/BLUE SHIELD | Admitting: Internal Medicine

## 2014-12-21 ENCOUNTER — Encounter: Payer: Self-pay | Admitting: Internal Medicine

## 2014-12-21 VITALS — BP 116/62 | HR 103 | Temp 97.7°F | Resp 12 | Wt 154.0 lb

## 2014-12-21 DIAGNOSIS — E0500 Thyrotoxicosis with diffuse goiter without thyrotoxic crisis or storm: Secondary | ICD-10-CM

## 2014-12-21 LAB — T4, FREE: FREE T4: 2.03 ng/dL — AB (ref 0.60–1.60)

## 2014-12-21 LAB — T3, FREE: T3 FREE: 5.2 pg/mL — AB (ref 2.3–4.2)

## 2014-12-21 LAB — TSH: TSH: 0.03 u[IU]/mL — ABNORMAL LOW (ref 0.35–4.50)

## 2014-12-21 NOTE — Progress Notes (Signed)
Patient ID: Kristina Powell, female   DOB: 1975/11/23, 39 y.o.   MRN: [id number]   HPI  Kristina Powell is a 39 y.o.-year-old female, returning for management of Graves ds. Last visit 3 mo ago.  Reviewed hx: Pt was dx with hyperthyroidism in 2005 >> developed this after she had her child >> [place] PCP in [place], where she lived at the time >> dx with Graves ds. (unclear if she had an uptake and scan then, but likely) >> tx with MMI from 2012-2014 >> stopped 06/2013.   I reviewed pt's thyroid tests: Lab Results  Component Value Date   TSH 0.05* 09/19/2014   TSH 0.12* 08/03/2014   TSH 0.438 11/06/2013   TSH 0.682 08/28/2013   TSH 1.075 07/17/2013   FREET4 1.99* 09/19/2014   FREET4 1.95* 08/03/2014   FREET4 1.41 08/28/2013   FREET4 1.47 07/17/2013  11/24/2013: TSH 0.86, fT4 1.4, TSI 68 (<140%) 07/02/2012: TSH 3.18, TT4 9.4 (4.5-12) 04/14/2012: TSH 1.81  Component     Latest Ref Rng 09/19/2014  TSI     <140 % baseline 219 (H)   She restarted MMI this week after last results returned >> 10 mg daily. She became constipated after this. Still not sleeping well. She is afraid to come off MMI. At last visit, we decreased the dose to 10 mg MMI bid. She did not return for repeat labs after the change (09/19/2014)  Pt denies feeling nodules in neck, hoarseness, dysphagia/odynophagia, SOB with lying down; she feels better: - resolved excessive sweating/heat intolerance - improved fatigue - resolved poor sleep - no tremors - improved anxiety - improved palpitations - no problems with concentration - no hyperdefecation - no weight loss/gain  She sees ophthalmology (Dr. Burgess Estelle) - 10/17/2014. Has eye drops. Has blurry vision.  ROS: Constitutional: see HPI Eyes: + blurry vision, no xerophthalmia ENT: no sore throat, no nodules palpated in throat, no dysphagia/odynophagia, no hoarseness Cardiovascular: no CP/SOB/palpitations/leg swelling Respiratory: no cough/SOB Gastrointestinal: no N/V/D/C/acid  reflux Musculoskeletal: no muscle/no joint aches Skin: no rashes Neurological: no tremors/numbness/tingling/dizziness  I reviewed pt's medications, allergies, PMH, social hx, family hx, and changes were documented in the history of present illness. Otherwise, unchanged from my initial visit note.  Past Medical History  Diagnosis Date  . Hyperthyroidism     x2 years, post childbirth  . Chicken pox    Past Surgical History  Procedure Laterality Date  . Unremarkable     History   Social History  . Marital Status: Married    Spouse Name: N/A    Number of Children: 1   Occupational History  . Credit analyst   Social History Main Topics  . Smoking status: Never Smoker   . Smokeless tobacco: No  . Alcohol Use: No  . Drug Use: No   Social History Narrative   Current Outpatient Prescriptions on File Prior to Visit  Medication Sig Dispense Refill  . methimazole (TAPAZOLE) 10 MG tablet Take 1 tablet (10 mg total) by mouth 2 (two) times daily. 60 tablet 2  . ampicillin (PRINCIPEN) 500 MG capsule Take 1 capsule (500 mg total) by mouth 4 (four) times daily. (Patient not taking: Reported on 12/21/2014) 28 capsule 0  . cephALEXin (KEFLEX) 500 MG capsule Take 1 capsule (500 mg total) by mouth 4 (four) times daily. (Patient not taking: Reported on 12/21/2014) 28 capsule 0  . ibuprofen (ADVIL,MOTRIN) 800 MG tablet Take 800 mg by mouth every 8 (eight) hours as needed.    . meloxicam (MOBIC)  15 MG tablet Take 1 tablet (15 mg total) by mouth daily. (Patient not taking: Reported on 12/21/2014) 30 tablet 0  . methimazole (TAPAZOLE) 5 MG tablet Take 1 tablet (5 mg total) by mouth 2 (two) times daily. (Patient not taking: Reported on 12/21/2014) 60 tablet 1  . Multiple Vitamin (MULTIVITAMIN) tablet Take 1 tablet by mouth daily.     No current facility-administered medications on file prior to visit.   No Known Allergies Family History  Problem Relation Age of Onset  . Hyperlipidemia Mother   .  Diabetes Maternal Grandmother   . Thyroid disease Mother   . Skin cancer Mother   . Healthy Father   . Colon cancer Maternal Grandfather   . Thyroid disease Sister    PE: BP 116/62 mmHg  Pulse 103  Temp(Src) 97.7 F (36.5 C) (Oral)  Resp 12  Wt 154 lb (69.854 kg)  SpO2 98% Body mass index is 26.65 kg/(m^2).  Wt Readings from Last 3 Encounters:  12/21/14 154 lb (69.854 kg)  08/19/14 151 lb 12.8 oz (68.856 kg)  08/03/14 151 lb (68.493 kg)   Constitutional: slightly overweight, in NAD Eyes: PERRLA, EOMI, no exophthalmos, no lid lag, no stare ENT: moist mucous membranes, + thyromegaly L>R, no thyroid bruits, no cervical lymphadenopathy Cardiovascular: tachycardia, RR, No MRG Respiratory: CTA B Gastrointestinal: abdomen soft, NT, ND, BS+ Musculoskeletal: no deformities, strength intact in all 4 Skin: moist, warm, no rashes Neurological: no tremor with outstretched hands, DTR +3/5 in  all 4  ASSESSMENT: 1. Graves disease  PLAN:  1. Patient with h/o Graves ds, initially resolved after tx with MMI, recently with a newly found low TSH + fT4, with some thyrotoxic sxs: heat intolerance, palpitations, anxiety. All they have all improved or resolved on MMI 10 mg bid. Will continue this dose for now and recheck the TFTs today. I hope we can decrease the dose of MMI soon.  - we discussed about possible modalities of treatment for the above conditions, to include methimazole use, radioactive iodine ablation or surgery. She would like to continue the MMI. - She knows to stop the Methimazole (Tapazole) and call us or your primary care doctor if you develop: - sore throat - fever - yellow skin - dark urine - light colored stools - she knows not to get pregnant until TFTs are normal.  - RTC in 3 months, but likely sooner for repeat labs  Component     Latest Ref Rng 12/21/2014  TSH     0.35 - 4.50 uIU/mL 0.03 (L)  Free T4     0.60 - 1.60 ng/dL 1.612.03 (H)  T3, Free     2.3 - 4.2 pg/mL  5.2 (H)    I'm very surprised about her thyroid tests since clinically, her hyperthyroidism appears much improved. I would advise her to increase the methimazole to 10 mg 3 times a day, but I am afraid that we may need to do the radioactive iodine treatment. We will need to repeat the thyroid tests in 4-5 weeks.

## 2014-12-21 NOTE — Patient Instructions (Signed)
Please stop at the lab.  Continue Methimazole 10 mg 2x a day.  Please come back for a follow-up appointment in 3 months.

## 2014-12-22 MED ORDER — METHIMAZOLE 10 MG PO TABS: 10.0000 mg | ORAL_TABLET | Freq: Three times a day (TID) | ORAL | Status: DC

## 2015-02-14 ENCOUNTER — Telehealth: Payer: Self-pay | Admitting: Internal Medicine

## 2015-02-14 NOTE — Telephone Encounter (Signed)
Done

## 2015-02-14 NOTE — Telephone Encounter (Signed)
Patient would like lab work   Please send via fax  [facility] to: [facility] Fax [phone]   Thank you

## 2015-02-22 ENCOUNTER — Encounter: Payer: Self-pay | Admitting: Internal Medicine

## 2015-02-22 ENCOUNTER — Other Ambulatory Visit: Payer: Self-pay | Admitting: Internal Medicine

## 2015-02-22 DIAGNOSIS — E0500 Thyrotoxicosis with diffuse goiter without thyrotoxic crisis or storm: Secondary | ICD-10-CM

## 2015-02-22 MED ORDER — METHIMAZOLE 10 MG PO TABS: 10.0000 mg | ORAL_TABLET | Freq: Two times a day (BID) | ORAL | Status: DC

## 2015-02-22 NOTE — Progress Notes (Signed)
Called pt and advised her per Dr Gherghe's note. Pt voiced understanding.  

## 2015-02-22 NOTE — Progress Notes (Signed)
Received labs from 02/16/2015: TSH 3.6, free T4 0.9, free T3 2.5  Will decrease the methimazole from 10 mg 3 times a day to only 2 times a day. We'll need to repeat her thyroid tests in 2 months.

## 2015-04-10 ENCOUNTER — Ambulatory Visit: Payer: BLUE CROSS/BLUE SHIELD | Admitting: Internal Medicine

## 2015-04-13 ENCOUNTER — Other Ambulatory Visit: Payer: Self-pay | Admitting: Internal Medicine

## 2015-04-14 ENCOUNTER — Telehealth: Payer: Self-pay | Admitting: Internal Medicine

## 2015-04-14 ENCOUNTER — Other Ambulatory Visit: Payer: Self-pay | Admitting: *Deleted

## 2015-04-14 ENCOUNTER — Other Ambulatory Visit: Payer: Self-pay

## 2015-04-14 ENCOUNTER — Telehealth: Payer: Self-pay

## 2015-04-14 DIAGNOSIS — E0500 Thyrotoxicosis with diffuse goiter without thyrotoxic crisis or storm: Secondary | ICD-10-CM

## 2015-04-14 NOTE — Telephone Encounter (Signed)
Please read message below and advise. Thank you.  

## 2015-04-14 NOTE — Telephone Encounter (Signed)
I have faxed the pt's lab requisitions to her job

## 2015-04-14 NOTE — Telephone Encounter (Signed)
Called pt to inform her that I have faxed over her lab requisitions to her job.Just calling to see if she has received them.

## 2015-04-14 NOTE — Telephone Encounter (Signed)
Sure, let's send her orders for TSH, fT4 and fT3. Thank you, C

## 2015-04-14 NOTE — Telephone Encounter (Signed)
Pt needs blood work order to be sent to her clinic at her job fax [phone], please send ASAP

## 2015-05-23 ENCOUNTER — Other Ambulatory Visit: Payer: Self-pay | Admitting: *Deleted

## 2015-05-23 ENCOUNTER — Encounter: Payer: Self-pay | Admitting: Internal Medicine

## 2015-05-23 ENCOUNTER — Ambulatory Visit (INDEPENDENT_AMBULATORY_CARE_PROVIDER_SITE_OTHER): Payer: BLUE CROSS/BLUE SHIELD | Admitting: Internal Medicine

## 2015-05-23 VITALS — BP 118/68 | HR 75 | Temp 97.8°F | Resp 12 | Wt 152.6 lb

## 2015-05-23 DIAGNOSIS — E0500 Thyrotoxicosis with diffuse goiter without thyrotoxic crisis or storm: Secondary | ICD-10-CM | POA: Diagnosis not present

## 2015-05-23 LAB — T4, FREE: Free T4: 0.91 ng/dL (ref 0.60–1.60)

## 2015-05-23 LAB — TSH: TSH: 2.35 u[IU]/mL (ref 0.35–4.50)

## 2015-05-23 LAB — T3, FREE: T3, Free: 3.6 pg/mL (ref 2.3–4.2)

## 2015-05-23 NOTE — Patient Instructions (Signed)
Please stop at the lab.  Continue Methimazole 10 mg 2x a day.  Please come back for a follow-up appointment in 6 months.  Radioiodine (I-131) Therapy for Hyperthyroidism Radioiodine (I-131) therapy for hyperthyroidism is a procedure used to treat an overactive thyroid (hyperthyroidism). The patient swallows I-131, which is a radioactive form of iodine. The I-131 destroys thyroid cells. The thyroid is a gland in the neck. It makes thyroid hormones, which control how cells throughout the body use energy. Hyperthyroidism is usually caused by Grave's disease or by growths within the thyroid (nodules). Symptoms may include:  Nervousness.  Irritability.  Problems with sleep.  Tiredness.  Fast heart rate.  Shaky hands.  Sweating.  Heat sensitivity.  Unintended weight loss.  Brittle hair.  Enlarged thyroid gland.  Menstrual changes.  Frequent stools. LET YOUR CAREGIVER KNOW ABOUT:   All allergies.  All medications that you are taking, including over-the-counter and prescription drugs, dietary supplements, vitamins, or herbal preparations.  Any previous complications from this or other procedures.  Smoking history.  Possibility of pregnancy.  History of bleeding problems.  Any other health problems. RISKS AND COMPLICATIONS  Risks of the procedure include:  Slight pain in the area of the thyroid gland. BEFORE THE PROCEDURE  If you are a woman, you may be asked to have pregnancy testing before the procedure.  If you have been taking thyroid medicines, you will usually be asked to stop them three days before your procedure.  You will usually be asked to stop eating and drinking at midnight the day of your procedure.  You will need to plan for someone to drive you home after the procedure. PROCEDURE  You will be asked to swallow the radioactive iodine in either pill or liquid form. It can take 1-3 months for the treatment to work. The treatment is most effective at  about 3-6 months after the dose of iodine has been given. In most people, a single dose of radioactive iodine resolves their hyperthyroidism, but a few people will require a second dose.  AFTER THE PROCEDURE  Because you will be giving off tiny amounts of radiation for several days, there are some special precautions you will be asked to follow for about 2-4 days after the procedure:  Avoid being around babies or pregnant women.  Do not use public bathrooms.  Flush twice after using the toilet.  Take a bath or shower every day.  Practice good hand washing.  Drink fluids normally.  Use disposable utensils, or clean your utensils separately from those of others.  Sleep alone.  Do not engage in intimate contact.  Wash your sheets, towels, and clothes each day, by themselves.  Do not make food for other people. Other precautions include:  Stopping breastfeeding.  Do not attempt to become pregnant for at least a year after you have had the procedure.  When traveling, bring a letter of explanation from your caregiver for three months. Radioactivity may trip detectors in airports or other places. Because the point of the procedure is to destroy your thyroid gland, you will need to take thyroid hormone by mouth for the rest of your life. HOME CARE INSTRUCTIONS   Ask your caregiver when you should resume or start thyroid medications.  Take all medications exactly as directed.  Follow any prescribed diet.  Follow instructions regarding both rest and physical activity. SEEK IMMEDIATE MEDICAL CARE IF:  You have a dry mouth.  You have a sore throat.  You have neck pain.  You have a tight sensation in the throat.  You have nausea and vomiting.  You are fatigued.  You have flushing.  You have bowel changes (either diarrhea or constipation). Document Released: 02/02/2009 Document Revised: 12/09/2011 Document Reviewed: 02/02/2009 [facility] Patient Information 2015  [place], [place]. This information is not intended to replace advice given to you by your health care provider. Make sure you discuss any questions you have with your health care provider.

## 2015-05-23 NOTE — Progress Notes (Signed)
Patient ID: Kristina Powell, female   DOB: May 19, 1976, 39 y.o.   MRN: [id number]   HPI  Kristina Powell is a 39 y.o.-year-old female, returning for management of Graves ds. Last visit 5 mo ago.  Reviewed hx: Pt was dx with hyperthyroidism in 2005 >> developed this after she had her child >> [place] in [place], where she lived at the time >> dx with Graves ds. (unclear if she had an uptake and scan then, but likely) >> tx with MMI from 2012-2014 >> stopped 06/2013 >> now back on MMI.  I reviewed pt's thyroid tests: 02/16/2015: TSH 3.6, free T4 0.9, free T3 2.5 - decrease the methimazole from 10 mg 3 times a day to only 2 times a day. Lab Results  Component Value Date   TSH 0.03* 12/21/2014   TSH 0.05* 09/19/2014   TSH 0.12* 08/03/2014   TSH 0.438 11/06/2013   TSH 0.682 08/28/2013   TSH 1.075 07/17/2013   FREET4 2.03* 12/21/2014   FREET4 1.99* 09/19/2014   FREET4 1.95* 08/03/2014   FREET4 1.41 08/28/2013   FREET4 1.47 07/17/2013  11/24/2013: TSH 0.86, fT4 1.4, TSI 68 (<140%) 07/02/2012: TSH 3.18, TT4 9.4 (4.5-12) 04/14/2012: TSH 1.81  Component     Latest Ref Rng 09/19/2014  TSI     <140 % baseline 219 (H)   Pt denies feeling nodules in neck, hoarseness, dysphagia/odynophagia, SOB with lying down; she feels worse after decreasing the MMI from 30 mg a day to 20 mg a day. - + excessive sweating/heat intolerance - no fatigue - no poor sleep - no tremors - no anxiety - no palpitations - no problems with concentration - no hyperdefecation - + a little weight loss - cutting out sugar  She sees ophthalmology (Dr. Burgess Estelle) - 10/17/2014. Has eye drops. Has blurry vision.  ROS: Constitutional: see HPI Eyes: no blurry vision, no xerophthalmia ENT: no sore throat, no nodules palpated in throat, no dysphagia/odynophagia, no hoarseness Cardiovascular: no CP/SOB/palpitations/leg swelling Respiratory: no cough/SOB Gastrointestinal: no N/V/D/C/acid reflux Musculoskeletal: no muscle/no joint  aches Skin: no rashes Neurological: no tremors/numbness/tingling/dizziness  I reviewed pt's medications, allergies, PMH, social hx, family hx, and changes were documented in the history of present illness. Otherwise, unchanged from my initial visit note.  Past Medical History  Diagnosis Date  . Hyperthyroidism     x2 years, post childbirth  . Chicken pox    Past Surgical History  Procedure Laterality Date  . Unremarkable     History   Social History  . Marital Status: Married    Spouse Name: N/A    Number of Children: 1   Occupational History  . Credit analyst   Social History Main Topics  . Smoking status: Never Smoker   . Smokeless tobacco: No  . Alcohol Use: No  . Drug Use: No   Social History Narrative   Current Outpatient Prescriptions on File Prior to Visit  Medication Sig Dispense Refill  . ibuprofen (ADVIL,MOTRIN) 800 MG tablet Take 800 mg by mouth every 8 (eight) hours as needed.    . methimazole (TAPAZOLE) 10 MG tablet Take 1 tablet (10 mg total) by mouth 2 (two) times daily. 60 tablet 2  . Multiple Vitamin (MULTIVITAMIN) tablet Take 1 tablet by mouth daily.     No current facility-administered medications on file prior to visit.   No Known Allergies Family History  Problem Relation Age of Onset  . Hyperlipidemia Mother   . Diabetes Maternal Grandmother   . Thyroid disease  Mother   . Skin cancer Mother   . Healthy Father   . Colon cancer Maternal Grandfather   . Thyroid disease Sister    PE: BP 118/68 mmHg  Pulse 75  Temp(Src) 97.8 F (36.6 C) (Oral)  Resp 12  Wt 152 lb 9.6 oz (69.219 kg)  SpO2 99% Body mass index is 26.41 kg/(m^2).  Wt Readings from Last 3 Encounters:  05/23/15 152 lb 9.6 oz (69.219 kg)  12/21/14 154 lb (69.854 kg)  08/19/14 151 lb 12.8 oz (68.856 kg)   Constitutional: slightly overweight, in NAD Eyes: PERRLA, EOMI, no exophthalmos, no lid lag, no stare ENT: moist mucous membranes, + thyromegaly L>R, no cervical  lymphadenopathy Cardiovascular: tachycardia, RR, No MRG Respiratory: CTA B Gastrointestinal: abdomen soft, NT, ND, BS+ Musculoskeletal: no deformities, strength intact in all 4 Skin: moist, warm, no rashes Neurological: no tremor with outstretched hands, DTR +3/5 in  all 4  ASSESSMENT: 1. Graves disease  PLAN:  1. Patient with h/o Graves ds, on tx with MMI, initially with thyrotoxic sxs, now improved/resolved on MMI. At last TFT check, we decreased MMI to 10 mg bid. She feels that her sxs are a little worse, but she admits that she is more stressed at work, which can contribute. Will continue this dose for now and recheck the TFTs today. I hope we can decrease the dose of MMI further - we again discussed about radioactive iodine ablation. Given also written info about this. She would like to avoid this if possible and continue the MMI. - She knows to stop the Methimazole (Tapazole) and call us or your primary care doctor if you develop: - sore throat - fever - yellow skin - dark urine - light colored stools - she knows not to get pregnant until TFTs are normal.  - RTC in 3 months, but likely sooner for repeat labs  Needs refills of MMi.   Office Visit on 05/23/2015  Component Date Value Ref Range Status  . T3, Free 05/23/2015 3.6  2.3 - 4.2 pg/mL Final  . Free T4 05/23/2015 0.91  0.60 - 1.60 ng/dL Final  . TSH 16/07/9603 2.35  0.35 - 4.50 uIU/mL Final   Message sent the patient: Dear Ms Harlynn, [place] news! Labs are normal! Let's continue Methimazole and decrease the dose to 5 mg 2x a day. Let's repeat the thyroid tests in 6 weeks. Sincerely, Carlus Pavlov MD

## 2015-05-24 MED ORDER — METHIMAZOLE 5 MG PO TABS: 5.0000 mg | ORAL_TABLET | Freq: Two times a day (BID) | ORAL | Status: DC

## 2015-07-21 ENCOUNTER — Other Ambulatory Visit: Payer: Self-pay | Admitting: *Deleted

## 2015-07-21 ENCOUNTER — Telehealth: Payer: Self-pay | Admitting: Internal Medicine

## 2015-07-21 DIAGNOSIS — E0500 Thyrotoxicosis with diffuse goiter without thyrotoxic crisis or storm: Secondary | ICD-10-CM

## 2015-07-21 NOTE — Telephone Encounter (Signed)
Please send over the orders for her labs to [phone]

## 2015-07-21 NOTE — Telephone Encounter (Signed)
Done

## 2015-08-15 ENCOUNTER — Encounter: Payer: Self-pay | Admitting: Internal Medicine

## 2015-08-15 ENCOUNTER — Other Ambulatory Visit: Payer: Self-pay | Admitting: Internal Medicine

## 2015-08-15 DIAGNOSIS — E0500 Thyrotoxicosis with diffuse goiter without thyrotoxic crisis or storm: Secondary | ICD-10-CM

## 2015-08-15 MED ORDER — METHIMAZOLE 5 MG PO TABS: 5.0000 mg | ORAL_TABLET | Freq: Every day | ORAL | Status: DC

## 2015-08-15 NOTE — Progress Notes (Signed)
Received labs from PCP from 08/02/2015:  TSH 3.46, free T4 1.2 (0.8-1.8), free T3 2.7 (2.3-4.2)  Will decrease methimazole from 5 mg twice a day to only once a day. We'll need a repeat set of labs in 6 weeks.

## 2015-09-19 ENCOUNTER — Telehealth: Payer: Self-pay | Admitting: Physician Assistant

## 2015-09-19 NOTE — Telephone Encounter (Signed)
Noted in HM. 

## 2015-09-19 NOTE — Telephone Encounter (Signed)
Pt declined the flu shot.

## 2015-10-04 ENCOUNTER — Encounter: Payer: BLUE CROSS/BLUE SHIELD | Admitting: Physician Assistant

## 2015-10-06 ENCOUNTER — Ambulatory Visit: Payer: BLUE CROSS/BLUE SHIELD | Admitting: Physician Assistant

## 2015-10-16 ENCOUNTER — Telehealth: Payer: Self-pay | Admitting: Internal Medicine

## 2015-10-16 NOTE — Telephone Encounter (Signed)
Lab orders need to be sent to the fax on file for the lab she goes to, pt is planning to got to the lab on Wednesday

## 2015-10-17 ENCOUNTER — Other Ambulatory Visit: Payer: Self-pay | Admitting: *Deleted

## 2015-10-17 DIAGNOSIS — E0500 Thyrotoxicosis with diffuse goiter without thyrotoxic crisis or storm: Secondary | ICD-10-CM

## 2015-10-17 NOTE — Telephone Encounter (Signed)
Lab orders faxed to [phone].

## 2015-10-23 LAB — TSH: TSH: 1.52 u[IU]/mL (ref 0.41–5.90)

## 2015-10-24 ENCOUNTER — Encounter: Payer: Self-pay | Admitting: Internal Medicine

## 2015-10-24 ENCOUNTER — Telehealth: Payer: Self-pay | Admitting: General Practice

## 2015-10-24 NOTE — Telephone Encounter (Signed)
Called pt to complete Pre-visit information/LMOVM to return call.  

## 2015-10-25 ENCOUNTER — Encounter: Payer: Self-pay | Admitting: Physician Assistant

## 2015-10-25 ENCOUNTER — Ambulatory Visit (INDEPENDENT_AMBULATORY_CARE_PROVIDER_SITE_OTHER): Payer: BLUE CROSS/BLUE SHIELD | Admitting: Physician Assistant

## 2015-10-25 ENCOUNTER — Encounter: Payer: Self-pay | Admitting: Internal Medicine

## 2015-10-25 VITALS — BP 117/71 | HR 73 | Temp 98.3°F | Ht 64.0 in | Wt 150.5 lb

## 2015-10-25 DIAGNOSIS — R058 Other specified cough: Secondary | ICD-10-CM

## 2015-10-25 DIAGNOSIS — H11441 Conjunctival cysts, right eye: Secondary | ICD-10-CM

## 2015-10-25 DIAGNOSIS — Z0000 Encounter for general adult medical examination without abnormal findings: Secondary | ICD-10-CM

## 2015-10-25 DIAGNOSIS — E0500 Thyrotoxicosis with diffuse goiter without thyrotoxic crisis or storm: Secondary | ICD-10-CM | POA: Diagnosis not present

## 2015-10-25 DIAGNOSIS — R05 Cough: Secondary | ICD-10-CM | POA: Diagnosis not present

## 2015-10-25 NOTE — Progress Notes (Signed)
Pre visit review using our clinic review tool, if applicable. No additional management support is needed unless otherwise documented below in the visit note. 

## 2015-10-25 NOTE — Patient Instructions (Signed)
Please go to the lab for blood work.  I will call you with your results. If your blood work is normal we will follow-up yearly for physicals.  If anything is abnormal we will treat you and get you in for a follow-up.  I encourage you to increase hydration and the amount of fiber in your diet.  Start a daily probiotic (Align, Culturelle, Digestive Advantage, etc.). If no bowel movement within 24 hours, take 2 Tbs of Milk of Magnesia in a 4 oz glass of warmed prune juice every 2-3 days to help promote bowel movement. If no results within 24 hours, then repeat above regimen, adding a Dulcolax stool softener to regimen. If this does not promote a bowel movement, please call the office.  Preventive Care for Adults, Female A healthy lifestyle and preventive care can promote health and wellness. Preventive health guidelines for women include the following key practices.  A routine yearly physical is a good way to check with your health care provider about your health and preventive screening. It is a chance to share any concerns and updates on your health and to receive a thorough exam.  Visit your dentist for a routine exam and preventive care every 6 months. Brush your teeth twice a day and floss once a day. Good oral hygiene prevents tooth decay and gum disease.  The frequency of eye exams is based on your age, health, family medical history, use of contact lenses, and other factors. Follow your health care provider's recommendations for frequency of eye exams.  Eat a healthy diet. Foods like vegetables, fruits, whole grains, low-fat dairy products, and lean protein foods contain the nutrients you need without too many calories. Decrease your intake of foods high in solid fats, added sugars, and salt. Eat the right amount of calories for you.Get information about a proper diet from your health care provider, if necessary.  Regular physical exercise is one of the most important things you can do for  your health. Most adults should get at least 150 minutes of moderate-intensity exercise (any activity that increases your heart rate and causes you to sweat) each week. In addition, most adults need muscle-strengthening exercises on 2 or more days a week.  Maintain a healthy weight. The body mass index (BMI) is a screening tool to identify possible weight problems. It provides an estimate of body fat based on height and weight. Your health care provider can find your BMI and can help you achieve or maintain a healthy weight.For adults 20 years and older:  A BMI below 18.5 is considered underweight.  A BMI of 18.5 to 24.9 is normal.  A BMI of 25 to 29.9 is considered overweight.  A BMI of 30 and above is considered obese.  Maintain normal blood lipids and cholesterol levels by exercising and minimizing your intake of saturated fat. Eat a balanced diet with plenty of fruit and vegetables. Blood tests for lipids and cholesterol should begin at age 29 and be repeated every 5 years. If your lipid or cholesterol levels are high, you are over 50, or you are at high risk for heart disease, you may need your cholesterol levels checked more frequently.Ongoing high lipid and cholesterol levels should be treated with medicines if diet and exercise are not working.  If you smoke, find out from your health care provider how to quit. If you do not use tobacco, do not start.  Lung cancer screening is recommended for adults aged 17-80 years who are at  high risk for developing lung cancer because of a history of smoking. A yearly low-dose CT scan of the lungs is recommended for people who have at least a 30-pack-year history of smoking and are a current smoker or have quit within the past 15 years. A pack year of smoking is smoking an average of 1 pack of cigarettes a day for 1 year (for example: 1 pack a day for 30 years or 2 packs a day for 15 years). Yearly screening should continue until the smoker has stopped  smoking for at least 15 years. Yearly screening should be stopped for people who develop a health problem that would prevent them from having lung cancer treatment.  If you are pregnant, do not drink alcohol. If you are breastfeeding, be very cautious about drinking alcohol. If you are not pregnant and choose to drink alcohol, do not have more than 1 drink per day. One drink is considered to be 12 ounces (355 mL) of beer, 5 ounces (148 mL) of wine, or 1.5 ounces (44 mL) of liquor.  Avoid use of street drugs. Do not share needles with anyone. Ask for help if you need support or instructions about stopping the use of drugs.  High blood pressure causes heart disease and increases the risk of stroke. Your blood pressure should be checked at least every 1 to 2 years. Ongoing high blood pressure should be treated with medicines if weight loss and exercise do not work.  If you are 36-45 years old, ask your health care provider if you should take aspirin to prevent strokes.  Diabetes screening is done by taking a blood sample to check your blood glucose level after you have not eaten for a certain period of time (fasting). If you are not overweight and you do not have risk factors for diabetes, you should be screened once every 3 years starting at age 39. If you are overweight or obese and you are 15-70 years of age, you should be screened for diabetes every year as part of your cardiovascular risk assessment.  Breast cancer screening is essential preventive care for women. You should practice "breast self-awareness." This means understanding the normal appearance and feel of your breasts and may include breast self-examination. Any changes detected, no matter how small, should be reported to a health care provider. Women in their 43s and 30s should have a clinical breast exam (CBE) by a health care provider as part of a regular health exam every 1 to 3 years. After age 64, women should have a CBE every year.  Starting at age 17, women should consider having a mammogram (breast X-ray test) every year. Women who have a family history of breast cancer should talk to their health care provider about genetic screening. Women at a high risk of breast cancer should talk to their health care providers about having an MRI and a mammogram every year.  Breast cancer gene (BRCA)-related cancer risk assessment is recommended for women who have family members with BRCA-related cancers. BRCA-related cancers include breast, ovarian, tubal, and peritoneal cancers. Having family members with these cancers may be associated with an increased risk for harmful changes (mutations) in the breast cancer genes BRCA1 and BRCA2. Results of the assessment will determine the need for genetic counseling and BRCA1 and BRCA2 testing.  Your health care provider may recommend that you be screened regularly for cancer of the pelvic organs (ovaries, uterus, and vagina). This screening involves a pelvic examination, including checking for microscopic  changes to the surface of your cervix (Pap test). You may be encouraged to have this screening done every 3 years, beginning at age 63.  For women ages 33-65, health care providers may recommend pelvic exams and Pap testing every 3 years, or they may recommend the Pap and pelvic exam, combined with testing for human papilloma virus (HPV), every 5 years. Some types of HPV increase your risk of cervical cancer. Testing for HPV may also be done on women of any age with unclear Pap test results.  Other health care providers may not recommend any screening for nonpregnant women who are considered low risk for pelvic cancer and who do not have symptoms. Ask your health care provider if a screening pelvic exam is right for you.  If you have had past treatment for cervical cancer or a condition that could lead to cancer, you need Pap tests and screening for cancer for at least 20 years after your treatment.  If Pap tests have been discontinued, your risk factors (such as having a new sexual partner) need to be reassessed to determine if screening should resume. Some women have medical problems that increase the chance of getting cervical cancer. In these cases, your health care provider may recommend more frequent screening and Pap tests.  Colorectal cancer can be detected and often prevented. Most routine colorectal cancer screening begins at the age of 14 years and continues through age 55 years. However, your health care provider may recommend screening at an earlier age if you have risk factors for colon cancer. On a yearly basis, your health care provider may provide home test kits to check for hidden blood in the stool. Use of a small camera at the end of a tube, to directly examine the colon (sigmoidoscopy or colonoscopy), can detect the earliest forms of colorectal cancer. Talk to your health care provider about this at age 4, when routine screening begins. Direct exam of the colon should be repeated every 5-10 years through age 72 years, unless early forms of precancerous polyps or small growths are found.  People who are at an increased risk for hepatitis B should be screened for this virus. You are considered at high risk for hepatitis B if:  You were born in a country where hepatitis B occurs often. Talk with your health care provider about which countries are considered high risk.  Your parents were born in a high-risk country and you have not received a shot to protect against hepatitis B (hepatitis B vaccine).  You have HIV or AIDS.  You use needles to inject street drugs.  You live with, or have sex with, someone who has hepatitis B.  You get hemodialysis treatment.  You take certain medicines for conditions like cancer, organ transplantation, and autoimmune conditions.  Hepatitis C blood testing is recommended for all people born from [birth year] through 1965 and any individual with known  risks for hepatitis C.  Practice safe sex. Use condoms and avoid high-risk sexual practices to reduce the spread of sexually transmitted infections (STIs). STIs include gonorrhea, chlamydia, syphilis, trichomonas, herpes, HPV, and human immunodeficiency virus (HIV). Herpes, HIV, and HPV are viral illnesses that have no cure. They can result in disability, cancer, and death.  You should be screened for sexually transmitted illnesses (STIs) including gonorrhea and chlamydia if:  You are sexually active and are younger than 24 years.  You are older than 24 years and your health care provider tells you that you are at risk  for this type of infection.  Your sexual activity has changed since you were last screened and you are at an increased risk for chlamydia or gonorrhea. Ask your health care provider if you are at risk.  If you are at risk of being infected with HIV, it is recommended that you take a prescription medicine daily to prevent HIV infection. This is called preexposure prophylaxis (PrEP). You are considered at risk if:  You are sexually active and do not regularly use condoms or know the HIV status of your partner(s).  You take drugs by injection.  You are sexually active with a partner who has HIV.  Talk with your health care provider about whether you are at high risk of being infected with HIV. If you choose to begin PrEP, you should first be tested for HIV. You should then be tested every 3 months for as long as you are taking PrEP.  Osteoporosis is a disease in which the bones lose minerals and strength with aging. This can result in serious bone fractures or breaks. The risk of osteoporosis can be identified using a bone density scan. Women ages 63 years and over and women at risk for fractures or osteoporosis should discuss screening with their health care providers. Ask your health care provider whether you should take a calcium supplement or vitamin D to reduce the rate of  osteoporosis.  Menopause can be associated with physical symptoms and risks. Hormone replacement therapy is available to decrease symptoms and risks. You should talk to your health care provider about whether hormone replacement therapy is right for you.  Use sunscreen. Apply sunscreen liberally and repeatedly throughout the day. You should seek shade when your shadow is shorter than you. Protect yourself by wearing long sleeves, pants, a wide-brimmed hat, and sunglasses year round, whenever you are outdoors.  Once a month, do a whole body skin exam, using a mirror to look at the skin on your back. Tell your health care provider of new moles, moles that have irregular borders, moles that are larger than a pencil eraser, or moles that have changed in shape or color.  Stay current with required vaccines (immunizations).  Influenza vaccine. All adults should be immunized every year.  Tetanus, diphtheria, and acellular pertussis (Td, Tdap) vaccine. Pregnant women should receive 1 dose of Tdap vaccine during each pregnancy. The dose should be obtained regardless of the length of time since the last dose. Immunization is preferred during the 27th-36th week of gestation. An adult who has not previously received Tdap or who does not know her vaccine status should receive 1 dose of Tdap. This initial dose should be followed by tetanus and diphtheria toxoids (Td) booster doses every 10 years. Adults with an unknown or incomplete history of completing a 3-dose immunization series with Td-containing vaccines should begin or complete a primary immunization series including a Tdap dose. Adults should receive a Td booster every 10 years.  Varicella vaccine. An adult without evidence of immunity to varicella should receive 2 doses or a second dose if she has previously received 1 dose. Pregnant females who do not have evidence of immunity should receive the first dose after pregnancy. This first dose should be  obtained before leaving the health care facility. The second dose should be obtained 4-8 weeks after the first dose.  Human papillomavirus (HPV) vaccine. Females aged 13-26 years who have not received the vaccine previously should obtain the 3-dose series. The vaccine is not recommended for use in  pregnant females. However, pregnancy testing is not needed before receiving a dose. If a female is found to be pregnant after receiving a dose, no treatment is needed. In that case, the remaining doses should be delayed until after the pregnancy. Immunization is recommended for any person with an immunocompromised condition through the age of 25 years if she did not get any or all doses earlier. During the 3-dose series, the second dose should be obtained 4-8 weeks after the first dose. The third dose should be obtained 24 weeks after the first dose and 16 weeks after the second dose.  Zoster vaccine. One dose is recommended for adults aged 43 years or older unless certain conditions are present.  Measles, mumps, and rubella (MMR) vaccine. Adults born before [birth year] generally are considered immune to measles and mumps. Adults born in [birth year] or later should have 1 or more doses of MMR vaccine unless there is a contraindication to the vaccine or there is laboratory evidence of immunity to each of the three diseases. A routine second dose of MMR vaccine should be obtained at least 28 days after the first dose for students attending postsecondary schools, health care workers, or international travelers. People who received inactivated measles vaccine or an unknown type of measles vaccine during 1963-1967 should receive 2 doses of MMR vaccine. People who received inactivated mumps vaccine or an unknown type of mumps vaccine before 1979 and are at high risk for mumps infection should consider immunization with 2 doses of MMR vaccine. For females of childbearing age, rubella immunity should be determined. If there is no evidence  of immunity, females who are not pregnant should be vaccinated. If there is no evidence of immunity, females who are pregnant should delay immunization until after pregnancy. Unvaccinated health care workers born before [birth year] who lack laboratory evidence of measles, mumps, or rubella immunity or laboratory confirmation of disease should consider measles and mumps immunization with 2 doses of MMR vaccine or rubella immunization with 1 dose of MMR vaccine.  Pneumococcal 13-valent conjugate (PCV13) vaccine. When indicated, a person who is uncertain of his immunization history and has no record of immunization should receive the PCV13 vaccine. All adults 46 years of age and older should receive this vaccine. An adult aged 76 years or older who has certain medical conditions and has not been previously immunized should receive 1 dose of PCV13 vaccine. This PCV13 should be followed with a dose of pneumococcal polysaccharide (PPSV23) vaccine. Adults who are at high risk for pneumococcal disease should obtain the PPSV23 vaccine at least 8 weeks after the dose of PCV13 vaccine. Adults older than 40 years of age who have normal immune system function should obtain the PPSV23 vaccine dose at least 1 year after the dose of PCV13 vaccine.  Pneumococcal polysaccharide (PPSV23) vaccine. When PCV13 is also indicated, PCV13 should be obtained first. All adults aged 31 years and older should be immunized. An adult younger than age 90 years who has certain medical conditions should be immunized. Any person who resides in a nursing home or long-term care facility should be immunized. An adult smoker should be immunized. People with an immunocompromised condition and certain other conditions should receive both PCV13 and PPSV23 vaccines. People with human immunodeficiency virus (HIV) infection should be immunized as soon as possible after diagnosis. Immunization during chemotherapy or radiation therapy should be avoided. Routine use  of PPSV23 vaccine is not recommended for American Indians, [place] Natives, or people younger than 65 years unless there  are medical conditions that require PPSV23 vaccine. When indicated, people who have unknown immunization and have no record of immunization should receive PPSV23 vaccine. One-time revaccination 5 years after the first dose of PPSV23 is recommended for people aged 19-64 years who have chronic kidney failure, nephrotic syndrome, asplenia, or immunocompromised conditions. People who received 1-2 doses of PPSV23 before age 83 years should receive another dose of PPSV23 vaccine at age 20 years or later if at least 5 years have passed since the previous dose. Doses of PPSV23 are not needed for people immunized with PPSV23 at or after age 24 years.  Meningococcal vaccine. Adults with asplenia or persistent complement component deficiencies should receive 2 doses of quadrivalent meningococcal conjugate (MenACWY-D) vaccine. The doses should be obtained at least 2 months apart. Microbiologists working with certain meningococcal bacteria, [employer] recruits, people at risk during an outbreak, and people who travel to or live in countries with a high rate of meningitis should be immunized. A first-year college student up through age 89 years who is living in a residence hall should receive a dose if she did not receive a dose on or after her 16th birthday. Adults who have certain high-risk conditions should receive one or more doses of vaccine.  Hepatitis A vaccine. Adults who wish to be protected from this disease, have certain high-risk conditions, work with hepatitis A-infected animals, work in hepatitis A research labs, or travel to or work in countries with a high rate of hepatitis A should be immunized. Adults who were previously unvaccinated and who anticipate close contact with an international adoptee during the first 60 days after arrival in the [place] from a country with a high rate of  hepatitis A should be immunized.  Hepatitis B vaccine. Adults who wish to be protected from this disease, have certain high-risk conditions, may be exposed to blood or other infectious body fluids, are household contacts or sex partners of hepatitis B positive people, are clients or workers in certain care facilities, or travel to or work in countries with a high rate of hepatitis B should be immunized.  Haemophilus influenzae type b (Hib) vaccine. A previously unvaccinated person with asplenia or sickle cell disease or having a scheduled splenectomy should receive 1 dose of Hib vaccine. Regardless of previous immunization, a recipient of a hematopoietic stem cell transplant should receive a 3-dose series 6-12 months after her successful transplant. Hib vaccine is not recommended for adults with HIV infection. Preventive Services / Frequency Ages 39 to 37 years  Blood pressure check.** / Every 3-5 years.  Lipid and cholesterol check.** / Every 5 years beginning at age 3.  Clinical breast exam.** / Every 3 years for women in their 75s and 74s.  BRCA-related cancer risk assessment.** / For women who have family members with a BRCA-related cancer (breast, ovarian, tubal, or peritoneal cancers).  Pap test.** / Every 2 years from ages 33 through 69. Every 3 years starting at age 75 through age 33 or 26 with a history of 3 consecutive normal Pap tests.  HPV screening.** / Every 3 years from ages 25 through ages 29 to 69 with a history of 3 consecutive normal Pap tests.  Hepatitis C blood test.** / For any individual with known risks for hepatitis C.  Skin self-exam. / Monthly.  Influenza vaccine. / Every year.  Tetanus, diphtheria, and acellular pertussis (Tdap, Td) vaccine.** / Consult your health care provider. Pregnant women should receive 1 dose of Tdap vaccine during each pregnancy.  1 dose of Td every 10 years.  Varicella vaccine.** / Consult your health care provider. Pregnant females  who do not have evidence of immunity should receive the first dose after pregnancy.  HPV vaccine. / 3 doses over 6 months, if 19 and younger. The vaccine is not recommended for use in pregnant females. However, pregnancy testing is not needed before receiving a dose.  Measles, mumps, rubella (MMR) vaccine.** / You need at least 1 dose of MMR if you were born in 1957 or later. You may also need a 2nd dose. For females of childbearing age, rubella immunity should be determined. If there is no evidence of immunity, females who are not pregnant should be vaccinated. If there is no evidence of immunity, females who are pregnant should delay immunization until after pregnancy.  Pneumococcal 13-valent conjugate (PCV13) vaccine.** / Consult your health care provider.  Pneumococcal polysaccharide (PPSV23) vaccine.** / 1 to 2 doses if you smoke cigarettes or if you have certain conditions.  Meningococcal vaccine.** / 1 dose if you are age 35 to 28 years and a Market researcher living in a residence hall, or have one of several medical conditions, you need to get vaccinated against meningococcal disease. You may also need additional booster doses.  Hepatitis A vaccine.** / Consult your health care provider.  Hepatitis B vaccine.** / Consult your health care provider.  Haemophilus influenzae type b (Hib) vaccine.** / Consult your health care provider. Ages 35 to 68 years  Blood pressure check.** / Every year.  Lipid and cholesterol check.** / Every 5 years beginning at age 24 years.  Lung cancer screening. / Every year if you are aged 28-80 years and have a 30-pack-year history of smoking and currently smoke or have quit within the past 15 years. Yearly screening is stopped once you have quit smoking for at least 15 years or develop a health problem that would prevent you from having lung cancer treatment.  Clinical breast exam.** / Every year after age 54 years.  BRCA-related cancer risk  assessment.** / For women who have family members with a BRCA-related cancer (breast, ovarian, tubal, or peritoneal cancers).  Mammogram.** / Every year beginning at age 48 years and continuing for as long as you are in good health. Consult with your health care provider.  Pap test.** / Every 3 years starting at age 40 years through age 43 or 70 years with a history of 3 consecutive normal Pap tests.  HPV screening.** / Every 3 years from ages 57 years through ages 40 to 46 years with a history of 3 consecutive normal Pap tests.  Fecal occult blood test (FOBT) of stool. / Every year beginning at age 32 years and continuing until age 84 years. You may not need to do this test if you get a colonoscopy every 10 years.  Flexible sigmoidoscopy or colonoscopy.** / Every 5 years for a flexible sigmoidoscopy or every 10 years for a colonoscopy beginning at age 77 years and continuing until age 95 years.  Hepatitis C blood test.** / For all people born from [birth year] through 1965 and any individual with known risks for hepatitis C.  Skin self-exam. / Monthly.  Influenza vaccine. / Every year.  Tetanus, diphtheria, and acellular pertussis (Tdap/Td) vaccine.** / Consult your health care provider. Pregnant women should receive 1 dose of Tdap vaccine during each pregnancy. 1 dose of Td every 10 years.  Varicella vaccine.** / Consult your health care provider. Pregnant females who do not have evidence  of immunity should receive the first dose after pregnancy.  Zoster vaccine.** / 1 dose for adults aged 109 years or older.  Measles, mumps, rubella (MMR) vaccine.** / You need at least 1 dose of MMR if you were born in 1957 or later. You may also need a second dose. For females of childbearing age, rubella immunity should be determined. If there is no evidence of immunity, females who are not pregnant should be vaccinated. If there is no evidence of immunity, females who are pregnant should delay immunization until  after pregnancy.  Pneumococcal 13-valent conjugate (PCV13) vaccine.** / Consult your health care provider.  Pneumococcal polysaccharide (PPSV23) vaccine.** / 1 to 2 doses if you smoke cigarettes or if you have certain conditions.  Meningococcal vaccine.** / Consult your health care provider.  Hepatitis A vaccine.** / Consult your health care provider.  Hepatitis B vaccine.** / Consult your health care provider.  Haemophilus influenzae type b (Hib) vaccine.** / Consult your health care provider. Ages 37 years and over  Blood pressure check.** / Every year.  Lipid and cholesterol check.** / Every 5 years beginning at age 45 years.  Lung cancer screening. / Every year if you are aged 63-80 years and have a 30-pack-year history of smoking and currently smoke or have quit within the past 15 years. Yearly screening is stopped once you have quit smoking for at least 15 years or develop a health problem that would prevent you from having lung cancer treatment.  Clinical breast exam.** / Every year after age 93 years.  BRCA-related cancer risk assessment.** / For women who have family members with a BRCA-related cancer (breast, ovarian, tubal, or peritoneal cancers).  Mammogram.** / Every year beginning at age 16 years and continuing for as long as you are in good health. Consult with your health care provider.  Pap test.** / Every 3 years starting at age 15 years through age 24 or 28 years with 3 consecutive normal Pap tests. Testing can be stopped between 65 and 70 years with 3 consecutive normal Pap tests and no abnormal Pap or HPV tests in the past 10 years.  HPV screening.** / Every 3 years from ages 89 years through ages 9 or 50 years with a history of 3 consecutive normal Pap tests. Testing can be stopped between 65 and 70 years with 3 consecutive normal Pap tests and no abnormal Pap or HPV tests in the past 10 years.  Fecal occult blood test (FOBT) of stool. / Every year beginning at  age 77 years and continuing until age 49 years. You may not need to do this test if you get a colonoscopy every 10 years.  Flexible sigmoidoscopy or colonoscopy.** / Every 5 years for a flexible sigmoidoscopy or every 10 years for a colonoscopy beginning at age 36 years and continuing until age 40 years.  Hepatitis C blood test.** / For all people born from [birth year] through 1965 and any individual with known risks for hepatitis C.  Osteoporosis screening.** / A one-time screening for women ages 30 years and over and women at risk for fractures or osteoporosis.  Skin self-exam. / Monthly.  Influenza vaccine. / Every year.  Tetanus, diphtheria, and acellular pertussis (Tdap/Td) vaccine.** / 1 dose of Td every 10 years.  Varicella vaccine.** / Consult your health care provider.  Zoster vaccine.** / 1 dose for adults aged 76 years or older.  Pneumococcal 13-valent conjugate (PCV13) vaccine.** / Consult your health care provider.  Pneumococcal polysaccharide (PPSV23) vaccine.** /  1 dose for all adults aged 83 years and older.  Meningococcal vaccine.** / Consult your health care provider.  Hepatitis A vaccine.** / Consult your health care provider.  Hepatitis B vaccine.** / Consult your health care provider.  Haemophilus influenzae type b (Hib) vaccine.** / Consult your health care provider. ** Family history and personal history of risk and conditions may change your health care provider's recommendations.   This information is not intended to replace advice given to you by your health care provider. Make sure you discuss any questions you have with your health care provider.   Document Released: 11/12/2001 Document Revised: 10/07/2014 Document Reviewed: 02/11/2011 Elsevier Interactive Patient Education [employer].

## 2015-10-25 NOTE — Progress Notes (Signed)
Patient presents to clinic today for annual exam.  Patient is fasting for labs.  Acute Concerns: Patient endorses a mass of R eye over the past several months. Denies pain or pruritus. Denies vision changes. Has seen Ophthalmology who diagnosed her with a conjunctival cyst. Patient would like a second opinion.   Patient is also requesting a referral to Allergy and Asthma. Would like to see a [facility] provider.   Chronic Issues: Grave's Disease -- Followed by Endocrinology. Endorses recent thyroid testing within normal limits. Continues on Methimazole.   Health Maintenance: Immunizations -- Declines flu and Tetanus. PAP -- followed by GYN. Up-to-date per patient.  Past Medical History  Diagnosis Date  . Hyperthyroidism     x2 years, post childbirth  . Chicken pox     Past Surgical History  Procedure Laterality Date  . Unremarkable      Current Outpatient Prescriptions on File Prior to Visit  Medication Sig Dispense Refill  . cholecalciferol (VITAMIN D) 1000 UNITS tablet Take 1,000 Units by mouth daily.    . methimazole (TAPAZOLE) 5 MG tablet Take 1 tablet (5 mg total) by mouth daily. 90 tablet 1  . Multiple Vitamin (MULTIVITAMIN) tablet Take 1 tablet by mouth daily.     No current facility-administered medications on file prior to visit.    No Known Allergies  Family History  Problem Relation Age of Onset  . Hyperlipidemia Mother   . Diabetes Maternal Grandmother   . Thyroid disease Mother   . Skin cancer Mother   . Healthy Father   . Colon cancer Maternal Grandfather   . Thyroid disease Sister     Social History   Social History  . Marital Status: Married    Spouse Name: N/A  . Number of Children: N/A  . Years of Education: N/A   Occupational History  . Not on file.   Social History Main Topics  . Smoking status: Never Smoker   . Smokeless tobacco: Never Used  . Alcohol Use: No  . Drug Use: No  . Sexual Activity: Yes   Other Topics Concern  . Not  on file   Social History Narrative   Review of Systems  Constitutional: Negative for fever and weight loss.  HENT: Negative for ear discharge, ear pain, hearing loss and tinnitus.   Eyes: Negative for blurred vision, double vision, photophobia and pain.       + eye lesion -- R eye  Respiratory: Negative for cough and shortness of breath.   Cardiovascular: Negative for chest pain and palpitations.  Gastrointestinal: Negative for heartburn, nausea, vomiting, abdominal pain, diarrhea, constipation, blood in stool and melena.  Genitourinary: Negative for dysuria, urgency, frequency, hematuria and flank pain.  Musculoskeletal: Negative for falls.  Neurological: Negative for dizziness, loss of consciousness and headaches.  Endo/Heme/Allergies: Negative for environmental allergies.  Psychiatric/Behavioral: Negative for depression, suicidal ideas, hallucinations and substance abuse. The patient is not nervous/anxious and does not have insomnia.     BP 117/71 mmHg  Pulse 73  Temp(Src) 98.3 F (36.8 C) (Oral)  Ht '5\' 4"'$  (1.626 m)  Wt 150 lb 8 oz (68.266 kg)  BMI 25.82 kg/m2  SpO2 98%  Physical Exam  Constitutional: She is oriented to person, place, and time and well-developed, well-nourished, and in no distress.  HENT:  Head: Normocephalic and atraumatic.  Right Ear: Tympanic membrane, external ear and ear canal normal.  Left Ear: Tympanic membrane, external ear and ear canal normal.  Nose: Nose normal. No  mucosal edema.  Mouth/Throat: Uvula is midline, oropharynx is clear and moist and mucous membranes are normal. No oropharyngeal exudate or posterior oropharyngeal erythema.  Eyes: Conjunctivae and EOM are normal. Pupils are equal, round, and reactive to light.    Neck: Neck supple. No thyromegaly present.  Cardiovascular: Normal rate, regular rhythm, normal heart sounds and intact distal pulses.   Pulmonary/Chest: Effort normal and breath sounds normal. No respiratory distress. She  has no wheezes. She has no rales.  Abdominal: Soft. Bowel sounds are normal. She exhibits no distension and no mass. There is no tenderness. There is no rebound and no guarding.  Lymphadenopathy:    She has no cervical adenopathy.  Neurological: She is alert and oriented to person, place, and time. No cranial nerve deficit.  Skin: Skin is warm and dry. No rash noted.  Psychiatric: Affect normal.  Vitals reviewed.   Recent Results (from the past 2160 hour(s))  TSH     Status: None   Collection Time: 10/23/15 12:00 AM  Result Value Ref Range   TSH 1.52 0.41 - 5.90 uIU/mL  CBC     Status: None   Collection Time: 10/25/15  5:08 PM  Result Value Ref Range   WBC 7.2 4.0 - 10.5 K/uL   RBC 4.70 3.87 - 5.11 Mil/uL   Platelets 279.0 150.0 - 400.0 K/uL   Hemoglobin 13.9 12.0 - 15.0 g/dL   HCT 41.5 36.0 - 46.0 %   MCV 88.2 78.0 - 100.0 fl   MCHC 33.4 30.0 - 36.0 g/dL   RDW 12.7 11.5 - 15.5 %  Comp Met (CMET)     Status: None   Collection Time: 10/25/15  5:08 PM  Result Value Ref Range   Sodium 138 135 - 145 mEq/L   Potassium 3.9 3.5 - 5.1 mEq/L   Chloride 103 96 - 112 mEq/L   CO2 28 19 - 32 mEq/L   Glucose, Bld 89 70 - 99 mg/dL   BUN 13 6 - 23 mg/dL   Creatinine, Ser 0.72 0.40 - 1.20 mg/dL   Total Bilirubin 0.3 0.2 - 1.2 mg/dL   Alkaline Phosphatase 65 39 - 117 U/L   AST 15 0 - 37 U/L   ALT 12 0 - 35 U/L   Total Protein 7.3 6.0 - 8.3 g/dL   Albumin 4.2 3.5 - 5.2 g/dL   Calcium 9.1 8.4 - 10.5 mg/dL   GFR 95.45 >60.00 mL/min  Urinalysis, Routine w reflex microscopic     Status: Abnormal   Collection Time: 10/25/15  5:08 PM  Result Value Ref Range   Color, Urine YELLOW Yellow;Lt. Yellow   APPearance CLEAR Clear   Specific Gravity, Urine 1.015 1.000-1.030   pH 7.5 5.0 - 8.0   Total Protein, Urine NEGATIVE Negative   Urine Glucose NEGATIVE Negative   Ketones, ur NEGATIVE Negative   Bilirubin Urine NEGATIVE Negative   Hgb urine dipstick NEGATIVE Negative   Urobilinogen, UA 0.2  0.0 - 1.0   Leukocytes, UA NEGATIVE Negative   Nitrite NEGATIVE Negative   WBC, UA 0-2/hpf 0-2/hpf   RBC / HPF 0-2/hpf 0-2/hpf   Squamous Epithelial / LPF Rare(0-4/hpf) Rare(0-4/hpf)   Bacteria, UA Rare(<10/hpf) (A) None  Hemoglobin A1c     Status: None   Collection Time: 10/25/15  5:08 PM  Result Value Ref Range   Hgb A1c MFr Bld 5.1 4.6 - 6.5 %    Comment: Glycemic Control Guidelines for People with Diabetes:Non Diabetic:  <6%Goal of Therapy: <7%Additional Action  Suggested:  >8%   Lipid Profile     Status: Abnormal   Collection Time: 10/25/15  5:08 PM  Result Value Ref Range   Cholesterol 187 0 - 200 mg/dL    Comment: ATP III Classification       Desirable:  < 200 mg/dL               Borderline High:  200 - 239 mg/dL          High:  > = 240 mg/dL   Triglycerides 86.0 0.0 - 149.0 mg/dL    Comment: Normal:  <150 mg/dLBorderline High:  150 - 199 mg/dL   HDL 69.80 >39.00 mg/dL   VLDL 17.2 0.0 - 40.0 mg/dL   LDL Cholesterol 100 (H) 0 - 99 mg/dL   Total CHOL/HDL Ratio 3     Comment:                Men          Women1/2 Average Risk     3.4          3.3Average Risk          5.0          4.42X Average Risk          9.6          7.13X Average Risk          15.0          11.0                       NonHDL 117.49     Comment: NOTE:  Non-HDL goal should be 30 mg/dL higher than patient's LDL goal (i.e. LDL goal of < 70 mg/dL, would have non-HDL goal of < 100 mg/dL)    Assessment/Plan: Allergic cough Referral to Allergy placed.  Graves disease Followed by Endocrinology. Continue current regimen and scheduled follow-up.  Visit for preventive health examination Depression screen negative. Health Maintenance reviewed -- Declines flu and Tetanus. PAP up-to-date per patient. Is followed by GYN. Preventive schedule discussed and handout given in AVS. Will obtain fasting labs today.   Conjunctival cyst of right eye Discussed diagnosis with patient. Reassurance given. Follow-up with OP if  worsening.

## 2015-10-26 DIAGNOSIS — Z0000 Encounter for general adult medical examination without abnormal findings: Secondary | ICD-10-CM | POA: Insufficient documentation

## 2015-10-26 DIAGNOSIS — H11441 Conjunctival cysts, right eye: Secondary | ICD-10-CM | POA: Insufficient documentation

## 2015-10-26 LAB — URINALYSIS, ROUTINE W REFLEX MICROSCOPIC
BILIRUBIN URINE: NEGATIVE
HGB URINE DIPSTICK: NEGATIVE
Ketones, ur: NEGATIVE
Leukocytes, UA: NEGATIVE
NITRITE: NEGATIVE
Specific Gravity, Urine: 1.015 (ref 1.000–1.030)
TOTAL PROTEIN, URINE-UPE24: NEGATIVE
Urine Glucose: NEGATIVE
Urobilinogen, UA: 0.2 (ref 0.0–1.0)
pH: 7.5 (ref 5.0–8.0)

## 2015-10-26 LAB — CBC
HEMATOCRIT: 41.5 % (ref 36.0–46.0)
Hemoglobin: 13.9 g/dL (ref 12.0–15.0)
MCHC: 33.4 g/dL (ref 30.0–36.0)
MCV: 88.2 fl (ref 78.0–100.0)
Platelets: 279 10*3/uL (ref 150.0–400.0)
RBC: 4.7 Mil/uL (ref 3.87–5.11)
RDW: 12.7 % (ref 11.5–15.5)
WBC: 7.2 10*3/uL (ref 4.0–10.5)

## 2015-10-26 LAB — COMPREHENSIVE METABOLIC PANEL
ALT: 12 U/L (ref 0–35)
AST: 15 U/L (ref 0–37)
Albumin: 4.2 g/dL (ref 3.5–5.2)
Alkaline Phosphatase: 65 U/L (ref 39–117)
BUN: 13 mg/dL (ref 6–23)
CHLORIDE: 103 meq/L (ref 96–112)
CO2: 28 meq/L (ref 19–32)
Calcium: 9.1 mg/dL (ref 8.4–10.5)
Creatinine, Ser: 0.72 mg/dL (ref 0.40–1.20)
GFR: 95.45 mL/min (ref >60.00)
GLUCOSE: 89 mg/dL (ref 70–99)
POTASSIUM: 3.9 meq/L (ref 3.5–5.1)
SODIUM: 138 meq/L (ref 135–145)
Total Bilirubin: 0.3 mg/dL (ref 0.2–1.2)
Total Protein: 7.3 g/dL (ref 6.0–8.3)

## 2015-10-26 LAB — LIPID PANEL
CHOL/HDL RATIO: 3
Cholesterol: 187 mg/dL (ref 0–200)
HDL: 69.8 mg/dL (ref >39.00)
LDL Cholesterol: 100 mg/dL — ABNORMAL HIGH (ref 0–99)
NonHDL: 117.49
TRIGLYCERIDES: 86 mg/dL (ref 0.0–149.0)
VLDL: 17.2 mg/dL (ref 0.0–40.0)

## 2015-10-26 LAB — HEMOGLOBIN A1C: Hgb A1c MFr Bld: 5.1 % (ref 4.6–6.5)

## 2015-10-26 NOTE — Assessment & Plan Note (Signed)
Discussed diagnosis with patient. Reassurance given. Follow-up with OP if worsening.

## 2015-10-26 NOTE — Assessment & Plan Note (Signed)
Followed by Endocrinology. Continue current regimen and scheduled follow-up.

## 2015-10-26 NOTE — Assessment & Plan Note (Signed)
Referral to Allergy placed.

## 2015-10-26 NOTE — Assessment & Plan Note (Signed)
Depression screen negative. Health Maintenance reviewed -- Declines flu and Tetanus. PAP up-to-date per patient. Is followed by GYN. Preventive schedule discussed and handout given in AVS. Will obtain fasting labs today.

## 2015-10-28 LAB — URINE CULTURE

## 2015-11-23 ENCOUNTER — Ambulatory Visit: Payer: BLUE CROSS/BLUE SHIELD | Admitting: Internal Medicine

## 2015-11-28 ENCOUNTER — Ambulatory Visit (INDEPENDENT_AMBULATORY_CARE_PROVIDER_SITE_OTHER): Payer: BLUE CROSS/BLUE SHIELD | Admitting: Licensed Clinical Social Worker

## 2015-11-28 DIAGNOSIS — F419 Anxiety disorder, unspecified: Secondary | ICD-10-CM

## 2015-12-14 ENCOUNTER — Ambulatory Visit (INDEPENDENT_AMBULATORY_CARE_PROVIDER_SITE_OTHER): Payer: BLUE CROSS/BLUE SHIELD | Admitting: Licensed Clinical Social Worker

## 2015-12-14 DIAGNOSIS — F419 Anxiety disorder, unspecified: Secondary | ICD-10-CM

## 2015-12-25 ENCOUNTER — Encounter: Payer: Self-pay | Admitting: Physician Assistant

## 2015-12-25 ENCOUNTER — Ambulatory Visit (INDEPENDENT_AMBULATORY_CARE_PROVIDER_SITE_OTHER): Payer: BLUE CROSS/BLUE SHIELD | Admitting: Physician Assistant

## 2015-12-25 VITALS — BP 110/68 | HR 80 | Temp 98.5°F | Ht 64.0 in | Wt 147.6 lb

## 2015-12-25 DIAGNOSIS — H6980 Other specified disorders of Eustachian tube, unspecified ear: Secondary | ICD-10-CM | POA: Insufficient documentation

## 2015-12-25 DIAGNOSIS — H6981 Other specified disorders of Eustachian tube, right ear: Secondary | ICD-10-CM

## 2015-12-25 MED ORDER — FLUTICASONE PROPIONATE 50 MCG/ACT NA SUSP: 2.0000 | Freq: Every day | NASAL | Status: DC

## 2015-12-25 NOTE — Patient Instructions (Signed)
Please use Flonase as directed.  Take a daily Claritin. This will help remove fluid from behind the ears.  Please start use of sour candy or sugar free candy to promote saliva production. I do not see any salivary gland swelling or tenderness on exam but that does not mean you have not had a stone causing issue. These measures will keep the ducts cleared out.  If symptoms are not improving, please let me set you up with ENT for further assessment.

## 2015-12-25 NOTE — Assessment & Plan Note (Signed)
Noted on exam. Will start OTC Flonase and Claritin daily to help remove fluid that is contributing to symptoms. See not evidence of parotid gland swelling or TMJ on exam. Instructed patient to suck on sour candy or sugar-free candy to promote salivary production. Recommend ENT assessment if symptoms not resolving.

## 2015-12-25 NOTE — Progress Notes (Signed)
Pre visit review using our clinic review tool, if applicable. No additional management support is needed unless otherwise documented below in the visit note. 

## 2015-12-25 NOTE — Progress Notes (Signed)
Patient presents to clinic today c/o pain in R ear with pressure and popping. Also notes some swelling occasionally around ear. Denies fever, chills, ear pain. Denies difficulty chewing or with ROM of jaw. Saw her dentist recently who ruled out a dental infection. Discussed with her she may be having issues with parotid gland. Denies dry mouth symptoms.  Past Medical History  Diagnosis Date  . Hyperthyroidism     x2 years, post childbirth  . Chicken pox     Current Outpatient Prescriptions on File Prior to Visit  Medication Sig Dispense Refill  . cholecalciferol (VITAMIN D) 1000 UNITS tablet Take 1,000 Units by mouth daily.    . methimazole (TAPAZOLE) 5 MG tablet Take 1 tablet (5 mg total) by mouth daily. 90 tablet 1  . Multiple Vitamin (MULTIVITAMIN) tablet Take 1 tablet by mouth daily.     No current facility-administered medications on file prior to visit.    No Known Allergies  Family History  Problem Relation Age of Onset  . Hyperlipidemia Mother   . Diabetes Maternal Grandmother   . Thyroid disease Mother   . Skin cancer Mother   . Healthy Father   . Colon cancer Maternal Grandfather   . Thyroid disease Sister     Social History   Social History  . Marital Status: Married    Spouse Name: N/A  . Number of Children: N/A  . Years of Education: N/A   Social History Main Topics  . Smoking status: Never Smoker   . Smokeless tobacco: Never Used  . Alcohol Use: No  . Drug Use: No  . Sexual Activity: Yes   Other Topics Concern  . None   Social History Narrative    Review of Systems - See HPI.  All other ROS are negative.  BP 110/68 mmHg  Pulse 80  Temp(Src) 98.5 F (36.9 C) (Oral)  Ht 5\' 4"  (1.626 m)  Wt 147 lb 9.6 oz (66.951 kg)  BMI 25.32 kg/m2  SpO2 99%  LMP 12/03/2015  Physical Exam  Constitutional: She is oriented to person, place, and time and well-developed, well-nourished, and in no distress.  HENT:  Head: Normocephalic and atraumatic.    Right Ear: Tympanic membrane is not erythematous, not retracted and not bulging. A middle ear effusion is present.  Left Ear: Tympanic membrane normal.  Negative TMJ pain with palpation. ROM intact without crepitus. Mucous membranes of oropharynx are moist. No evidence of R parotid gland tenderness or swelling on examination  Eyes: Conjunctivae are normal.  Neck: Neck supple.  Cardiovascular: Normal rate, regular rhythm, normal heart sounds and intact distal pulses.   Pulmonary/Chest: Effort normal and breath sounds normal. No respiratory distress. She has no wheezes. She has no rales. She exhibits no tenderness.  Lymphadenopathy:    She has no cervical adenopathy.  Neurological: She is alert and oriented to person, place, and time.  Skin: Skin is warm and dry. No rash noted.  Psychiatric: Affect normal.  Vitals reviewed.   Recent Results (from the past 2160 hour(s))  TSH     Status: None   Collection Time: 10/23/15 12:00 AM  Result Value Ref Range   TSH 1.52 0.41 - 5.90 uIU/mL  CBC     Status: None   Collection Time: 10/25/15  5:08 PM  Result Value Ref Range   WBC 7.2 4.0 - 10.5 K/uL   RBC 4.70 3.87 - 5.11 Mil/uL   Platelets 279.0 150.0 - 400.0 K/uL   Hemoglobin 13.9 12.0 -  15.0 g/dL   HCT 41.5 36.0 - 46.0 %   MCV 88.2 78.0 - 100.0 fl   MCHC 33.4 30.0 - 36.0 g/dL   RDW 12.7 11.5 - 15.5 %  Comp Met (CMET)     Status: None   Collection Time: 10/25/15  5:08 PM  Result Value Ref Range   Sodium 138 135 - 145 mEq/L   Potassium 3.9 3.5 - 5.1 mEq/L   Chloride 103 96 - 112 mEq/L   CO2 28 19 - 32 mEq/L   Glucose, Bld 89 70 - 99 mg/dL   BUN 13 6 - 23 mg/dL   Creatinine, Ser 0.72 0.40 - 1.20 mg/dL   Total Bilirubin 0.3 0.2 - 1.2 mg/dL   Alkaline Phosphatase 65 39 - 117 U/L   AST 15 0 - 37 U/L   ALT 12 0 - 35 U/L   Total Protein 7.3 6.0 - 8.3 g/dL   Albumin 4.2 3.5 - 5.2 g/dL   Calcium 9.1 8.4 - 10.5 mg/dL   GFR 95.45 >60.00 mL/min  Urinalysis, Routine w reflex microscopic      Status: Abnormal   Collection Time: 10/25/15  5:08 PM  Result Value Ref Range   Color, Urine YELLOW Yellow;Lt. Yellow   APPearance CLEAR Clear   Specific Gravity, Urine 1.015 1.000-1.030   pH 7.5 5.0 - 8.0   Total Protein, Urine NEGATIVE Negative   Urine Glucose NEGATIVE Negative   Ketones, ur NEGATIVE Negative   Bilirubin Urine NEGATIVE Negative   Hgb urine dipstick NEGATIVE Negative   Urobilinogen, UA 0.2 0.0 - 1.0   Leukocytes, UA NEGATIVE Negative   Nitrite NEGATIVE Negative   WBC, UA 0-2/hpf 0-2/hpf   RBC / HPF 0-2/hpf 0-2/hpf   Squamous Epithelial / LPF Rare(0-4/hpf) Rare(0-4/hpf)   Bacteria, UA Rare(<10/hpf) (A) None  Hemoglobin A1c     Status: None   Collection Time: 10/25/15  5:08 PM  Result Value Ref Range   Hgb A1c MFr Bld 5.1 4.6 - 6.5 %    Comment: Glycemic Control Guidelines for People with Diabetes:Non Diabetic:  <6%Goal of Therapy: <7%Additional Action Suggested:  >8%   Lipid Profile     Status: Abnormal   Collection Time: 10/25/15  5:08 PM  Result Value Ref Range   Cholesterol 187 0 - 200 mg/dL    Comment: ATP III Classification       Desirable:  < 200 mg/dL               Borderline High:  200 - 239 mg/dL          High:  > = 240 mg/dL   Triglycerides 86.0 0.0 - 149.0 mg/dL    Comment: Normal:  <150 mg/dLBorderline High:  150 - 199 mg/dL   HDL 69.80 >39.00 mg/dL   VLDL 17.2 0.0 - 40.0 mg/dL   LDL Cholesterol 100 (H) 0 - 99 mg/dL   Total CHOL/HDL Ratio 3     Comment:                Men          Women1/2 Average Risk     3.4          3.3Average Risk          5.0          4.42X Average Risk          9.6          7.13X Average Risk  15.0          11.0                       NonHDL 117.49     Comment: NOTE:  Non-HDL goal should be 30 mg/dL higher than patient's LDL goal (i.e. LDL goal of < 70 mg/dL, would have non-HDL goal of < 100 mg/dL)  Urine Culture     Status: None   Collection Time: 10/26/15  8:40 AM  Result Value Ref Range   Colony Count 75,000  COLONIES/ML    Organism ID, Bacteria Multiple bacterial morphotypes present, none    Organism ID, Bacteria predominant. Suggest appropriate recollection if     Organism ID, Bacteria clinically indicated.     Assessment/Plan: Eustachian tube dysfunction Noted on exam. Will start OTC Flonase and Claritin daily to help remove fluid that is contributing to symptoms. See not evidence of parotid gland swelling or TMJ on exam. Instructed patient to suck on sour candy or sugar-free candy to promote salivary production. Recommend ENT assessment if symptoms not resolving.

## 2016-01-04 ENCOUNTER — Ambulatory Visit (INDEPENDENT_AMBULATORY_CARE_PROVIDER_SITE_OTHER): Payer: BLUE CROSS/BLUE SHIELD | Admitting: Licensed Clinical Social Worker

## 2016-01-04 DIAGNOSIS — F419 Anxiety disorder, unspecified: Secondary | ICD-10-CM | POA: Diagnosis not present

## 2016-01-18 ENCOUNTER — Ambulatory Visit (INDEPENDENT_AMBULATORY_CARE_PROVIDER_SITE_OTHER): Payer: BLUE CROSS/BLUE SHIELD | Admitting: Licensed Clinical Social Worker

## 2016-01-18 DIAGNOSIS — F419 Anxiety disorder, unspecified: Secondary | ICD-10-CM | POA: Diagnosis not present

## 2016-01-22 ENCOUNTER — Encounter: Payer: Self-pay | Admitting: Internal Medicine

## 2016-01-22 ENCOUNTER — Ambulatory Visit (INDEPENDENT_AMBULATORY_CARE_PROVIDER_SITE_OTHER): Payer: BLUE CROSS/BLUE SHIELD | Admitting: Internal Medicine

## 2016-01-22 VITALS — BP 100/62 | HR 98 | Temp 98.2°F | Resp 12 | Wt 149.8 lb

## 2016-01-22 DIAGNOSIS — E0500 Thyrotoxicosis with diffuse goiter without thyrotoxic crisis or storm: Secondary | ICD-10-CM | POA: Diagnosis not present

## 2016-01-22 NOTE — Patient Instructions (Addendum)
Please stop at the lab.  Continue Methimazole 5 mg 1x a day.  Please come back for a follow-up appointment in 8 months.

## 2016-01-22 NOTE — Progress Notes (Signed)
Patient ID: Kristina CroftsKelly Massett, female   DOB: 12/25/1975, 40 y.o.   MRN: [id number]   HPI  Kristina Powell is a 40 y.o.-year-old female, returning for management of Graves ds. Last visit 8 mo ago.  She has a lot of stress at work and at home.   Reviewed hx: Pt was dx with hyperthyroidism in 2005 >> developed this after she had her child >> [place] PCP in [place], where she lived at the time >> dx with Graves ds. (unclear if she had an uptake and scan then, but likely) >> tx with MMI from 2012-2014 >> stopped 06/2013 >> now back on MMI.  We had to restart MMI >> dose subsequently decreased to 5 mg daily in 08/2015 >> Now continues on this dose.  I reviewed pt's thyroid tests: Lab Results  Component Value Date   TSH 1.52 10/23/2015   TSH 2.35 05/23/2015   TSH 0.03* 12/21/2014   TSH 0.05* 09/19/2014   TSH 0.12* 08/03/2014   TSH 0.438 11/06/2013   TSH 0.682 08/28/2013   TSH 1.075 07/17/2013   FREET4 0.91 05/23/2015   FREET4 2.03* 12/21/2014   FREET4 1.99* 09/19/2014   FREET4 1.95* 08/03/2014   FREET4 1.41 08/28/2013   FREET4 1.47 07/17/2013  02/16/2015: TSH 3.6, free T4 0.9, free T3 2.5 - decreased the methimazole from 10 mg 3 times a day to only 2 times a day. 11/24/2013: TSH 0.86, fT4 1.4, TSI 68 (<140%) 07/02/2012: TSH 3.18, TT4 9.4 (4.5-12) 04/14/2012: TSH 1.81  Component     Latest Ref Rng 09/19/2014  TSI     <140 % baseline 219 (H)   Pt denies feeling nodules in neck, hoarseness, dysphagia/odynophagia, SOB with lying down: - no excessive sweating/heat intolerance - no weight issues - no fatigue - no poor sleep - no tremors - no anxiety - no palpitations - no problems with concentration - no hyperdefecation  She sees ophthalmology (Dr. Burgess Estelleanner) - 10/17/2014. Has eye drops. Has blurry vision.  She will have pterygium sx in July.  ROS: Constitutional: see HPI Eyes: no blurry vision, no xerophthalmia ENT: no sore throat, no nodules palpated in throat, no dysphagia/odynophagia, no  hoarseness Cardiovascular: no CP/SOB/palpitations/leg swelling Respiratory: no cough/SOB Gastrointestinal: no N/V/D/C/acid reflux Musculoskeletal: no muscle/no joint aches Skin: no rashes Neurological: no tremors/numbness/tingling/dizziness  I reviewed pt's medications, allergies, PMH, social hx, family hx, and changes were documented in the history of present illness. Otherwise, unchanged from my initial visit note.  Past Medical History  Diagnosis Date  . Hyperthyroidism     x2 years, post childbirth  . Chicken pox    Past Surgical History  Procedure Laterality Date  . Unremarkable     History   Social History  . Marital Status: Married    Spouse Name: N/A    Number of Children: 1   Occupational History  . Credit analyst   Social History Main Topics  . Smoking status: Never Smoker   . Smokeless tobacco: No  . Alcohol Use: No  . Drug Use: No   Social History Narrative   Current Outpatient Prescriptions on File Prior to Visit  Medication Sig Dispense Refill  . cholecalciferol (VITAMIN D) 1000 UNITS tablet Take 1,000 Units by mouth daily.    . fluticasone (FLONASE) 50 MCG/ACT nasal spray Place 2 sprays into both nostrils daily. 16 g 6  . loteprednol (LOTEMAX) 0.5 % ophthalmic suspension PLACE 1 DROP INTO THE RIGHT EYE AS NEEDED..    . methimazole (TAPAZOLE) 5 MG tablet  Take 1 tablet (5 mg total) by mouth daily. 90 tablet 1  . Multiple Vitamin (MULTIVITAMIN) tablet Take 1 tablet by mouth daily.    Bertram Gala Glycol-Propyl Glycol (SYSTANE) 0.4-0.3 % SOLN Apply to eye. Place 1 drop into the right eye 2 times a daily.     No current facility-administered medications on file prior to visit.   No Known Allergies Family History  Problem Relation Age of Onset  . Hyperlipidemia Mother   . Diabetes Maternal Grandmother   . Thyroid disease Mother   . Skin cancer Mother   . Healthy Father   . Colon cancer Maternal Grandfather   . Thyroid disease Sister    PE: BP  100/62 mmHg  Pulse 98  Temp(Src) 98.2 F (36.8 C) (Oral)  Resp 12  Wt 149 lb 12.8 oz (67.949 kg)  SpO2 99%  LMP 12/03/2015 Body mass index is 25.7 kg/(m^2).  Wt Readings from Last 3 Encounters:  01/22/16 149 lb 12.8 oz (67.949 kg)  12/25/15 147 lb 9.6 oz (66.951 kg)  10/25/15 150 lb 8 oz (68.266 kg)   Constitutional: slightly overweight, in NAD Eyes: PERRLA, EOMI, no exophthalmos, no lid lag, no stare; + pterygium in L eye ENT: moist mucous membranes, + thyromegaly L>R, no cervical lymphadenopathy Cardiovascular: tachycardia, RR, No MRG Respiratory: CTA B Gastrointestinal: abdomen soft, NT, ND, BS+ Musculoskeletal: no deformities, strength intact in all 4 Skin: moist, warm, no rashes Neurological: no tremor with outstretched hands, DTR +3/5 in  all 4  ASSESSMENT: 1. Graves disease  PLAN:  1. Patient with h/o Graves ds, on tx with MMI, initially with thyrotoxic sxs, now improved/resolved on MMI, but reappeared after stopping MMI. We restarted MMI and we are decreasing the dose slowly. We decreased her dose of MMI 5 mo ago >> to 5 mg daily. She continues this today, pending repeat TFTs - We discussed about radioactive iodine ablation at last visit, but she would like to avoid this if possible and continue the MMI. She does respond to MMI but we need a slower taper.  - She knows to stop the Methimazole (Tapazole) and call us or your primary care doctor if: - sore throat - fever - yellow skin - dark urine - light colored stools - she knows not to get pregnant unless TFTs are normal. No immediate plans for pregnancy. - RTC in 8 months, but likely sooner for repeat labs  Pt prefers to get labs at work (lab of [employer]) >> fax. [phone]  Component     Latest Ref Rng 01/22/2016  TSH     0.35 - 4.50 uIU/mL 0.84  T4,Free(Direct)     0.60 - 1.60 ng/dL 0.98  Triiodothyronine,Free,Serum     2.3 - 4.2 pg/mL 3.5   TFTs normal, but since TSH is lower, will continue MMI 5 mg daily  for 3 more months >> recheck TFTs then.

## 2016-01-23 LAB — T4, FREE: FREE T4: 0.97 ng/dL (ref 0.60–1.60)

## 2016-01-23 LAB — TSH: TSH: 0.84 u[IU]/mL (ref 0.35–4.50)

## 2016-01-23 LAB — T3, FREE: T3 FREE: 3.5 pg/mL (ref 2.3–4.2)

## 2016-02-15 ENCOUNTER — Ambulatory Visit (INDEPENDENT_AMBULATORY_CARE_PROVIDER_SITE_OTHER): Payer: BLUE CROSS/BLUE SHIELD | Admitting: Licensed Clinical Social Worker

## 2016-02-15 DIAGNOSIS — F419 Anxiety disorder, unspecified: Secondary | ICD-10-CM | POA: Diagnosis not present

## 2016-03-14 ENCOUNTER — Ambulatory Visit (INDEPENDENT_AMBULATORY_CARE_PROVIDER_SITE_OTHER): Payer: BLUE CROSS/BLUE SHIELD | Admitting: Licensed Clinical Social Worker

## 2016-03-14 DIAGNOSIS — F419 Anxiety disorder, unspecified: Secondary | ICD-10-CM

## 2016-03-22 DIAGNOSIS — E0500 Thyrotoxicosis with diffuse goiter without thyrotoxic crisis or storm: Secondary | ICD-10-CM | POA: Diagnosis not present

## 2016-03-27 ENCOUNTER — Encounter: Payer: Self-pay | Admitting: Internal Medicine

## 2016-03-27 ENCOUNTER — Other Ambulatory Visit: Payer: Self-pay | Admitting: Internal Medicine

## 2016-03-27 DIAGNOSIS — E0500 Thyrotoxicosis with diffuse goiter without thyrotoxic crisis or storm: Secondary | ICD-10-CM

## 2016-03-27 MED ORDER — METHIMAZOLE 5 MG PO TABS: 2.5000 mg | ORAL_TABLET | Freq: Every day | ORAL | Status: DC

## 2016-03-27 NOTE — Progress Notes (Signed)
Received labs from PCP, drawn on 03/22/2016: - TSH 2.14, free T4 1.3 (0.8-1.8), free T3 2.8 (2.3-4.2) -all normal  Previous labs, from 10/23/2015: - TSH 1.52, free T4 1.3, free T3 2.5  She is currently on methimazole 5 mg daily. Will decrease the methimazole to 5 mg every other day or 2.5 mg daily. We'll repeat the labs in 5-6 weeks.

## 2016-03-30 HISTORY — PX: EYE SURGERY: SHX253

## 2016-04-04 DIAGNOSIS — H11051 Peripheral pterygium, progressive, right eye: Secondary | ICD-10-CM | POA: Diagnosis not present

## 2016-04-04 DIAGNOSIS — H11001 Unspecified pterygium of right eye: Secondary | ICD-10-CM | POA: Diagnosis not present

## 2016-04-11 ENCOUNTER — Ambulatory Visit: Payer: BLUE CROSS/BLUE SHIELD | Admitting: Licensed Clinical Social Worker

## 2016-05-29 DIAGNOSIS — Z01411 Encounter for gynecological examination (general) (routine) with abnormal findings: Secondary | ICD-10-CM | POA: Diagnosis not present

## 2016-05-29 DIAGNOSIS — L280 Lichen simplex chronicus: Secondary | ICD-10-CM | POA: Diagnosis not present

## 2016-06-13 ENCOUNTER — Telehealth: Payer: Self-pay | Admitting: Internal Medicine

## 2016-06-13 ENCOUNTER — Telehealth: Payer: Self-pay

## 2016-06-13 NOTE — Telephone Encounter (Signed)
Faxed over lab orders to previous number we sent to last time. Will wait for confirmation.

## 2016-06-13 NOTE — Telephone Encounter (Signed)
Patient ask you to send her lab orders to her job [employer] she stated the fax # is in your records.

## 2016-06-20 DIAGNOSIS — E0500 Thyrotoxicosis with diffuse goiter without thyrotoxic crisis or storm: Secondary | ICD-10-CM | POA: Diagnosis not present

## 2016-06-26 ENCOUNTER — Telehealth: Payer: Self-pay

## 2016-06-26 ENCOUNTER — Encounter: Payer: Self-pay | Admitting: Internal Medicine

## 2016-06-26 ENCOUNTER — Other Ambulatory Visit: Payer: Self-pay

## 2016-06-26 DIAGNOSIS — E0500 Thyrotoxicosis with diffuse goiter without thyrotoxic crisis or storm: Secondary | ICD-10-CM

## 2016-06-26 NOTE — Telephone Encounter (Signed)
Called patient and advised of her lab results from [facility] that we received. Patient is requesting to stay on the medication for right now as she has a lot of stress going on, and she would be afraid her numbers on the thyroid would go back up. If okay to stay on medication for now she would like a refill. I put the orders in for the 3 labs, but she did not make an appointment yet to see what you would want her to do. Please advise. Thank you!

## 2016-06-26 NOTE — Progress Notes (Signed)
Received labs from PCP, drawn on 06/20/2016: - TSH 0.77 - Free T4 1.5 - Free T3 2.9 Labs are normal, therefore, we can try to stop methimazole (now on 2.5 mg every other day) and recheck her tests in 5 weeks.

## 2016-06-27 NOTE — Telephone Encounter (Signed)
Called and left voicemail to let patient know and refilled the persription

## 2016-06-27 NOTE — Telephone Encounter (Signed)
Yes, no pb! 

## 2016-07-10 ENCOUNTER — Encounter: Payer: Self-pay | Admitting: Physician Assistant

## 2016-07-10 ENCOUNTER — Ambulatory Visit (INDEPENDENT_AMBULATORY_CARE_PROVIDER_SITE_OTHER): Payer: BLUE CROSS/BLUE SHIELD | Admitting: Physician Assistant

## 2016-07-10 VITALS — BP 110/72 | HR 83 | Temp 98.3°F | Resp 16 | Ht 64.0 in | Wt 147.0 lb

## 2016-07-10 DIAGNOSIS — G629 Polyneuropathy, unspecified: Secondary | ICD-10-CM

## 2016-07-10 LAB — BASIC METABOLIC PANEL
BUN: 13 mg/dL (ref 6–23)
CALCIUM: 9.2 mg/dL (ref 8.4–10.5)
CO2: 24 meq/L (ref 19–32)
CREATININE: 0.63 mg/dL (ref 0.40–1.20)
Chloride: 107 mEq/L (ref 96–112)
GFR: 110.95 mL/min (ref >60.00)
GLUCOSE: 72 mg/dL (ref 70–99)
Potassium: 3.8 mEq/L (ref 3.5–5.1)
Sodium: 139 mEq/L (ref 135–145)

## 2016-07-10 LAB — VITAMIN B12: Vitamin B-12: 273 pg/mL (ref 211–911)

## 2016-07-10 NOTE — Patient Instructions (Signed)
Please go to the lab for blood work. We will call you with your results.  You will be contacted for further assessment by neurology.  Follow-up with Dr. Elvera LennoxGherghe as scheduled and as needed for refills.

## 2016-07-10 NOTE — Progress Notes (Signed)
Patient presents to clinic today c/o 1 year of gradually worsening tingling and numbness in hands and feet bilaterally, significantly worsening over the past month. Also notes pain in R foot and ankle with ambulation. Denies recent trauma or injury. Pain is alleviated with rest. Has impacted ability to exercise. Denies swelling. Did buy new supportive shoes that have helped some with pain. Patient endorses potential history of getting b12 supplementation but is unsure of anemia.   Past Medical History:  Diagnosis Date  . Chicken pox   . Hyperthyroidism    x2 years, post childbirth    Current Outpatient Prescriptions on File Prior to Visit  Medication Sig Dispense Refill  . cholecalciferol (VITAMIN D) 1000 UNITS tablet Take 1,000 Units by mouth daily.    . fluticasone (FLONASE) 50 MCG/ACT nasal spray Place 2 sprays into both nostrils daily. (Patient taking differently: Place 2 sprays into both nostrils daily as needed. ) 16 g 6  . loteprednol (LOTEMAX) 0.5 % ophthalmic suspension PLACE 1 DROP INTO THE RIGHT EYE AS NEEDED..    . methimazole (TAPAZOLE) 5 MG tablet Take 0.5 tablets (2.5 mg total) by mouth daily. 90 tablet 1  . Multiple Vitamin (MULTIVITAMIN) tablet Take 1 tablet by mouth daily.    Bertram Gala. Polyethyl Glycol-Propyl Glycol (SYSTANE) 0.4-0.3 % SOLN Apply to eye as needed. Place 1 drop into the right eye 2 times a daily.      No current facility-administered medications on file prior to visit.     No Known Allergies  Family History  Problem Relation Age of Onset  . Hyperlipidemia Mother   . Diabetes Maternal Grandmother   . Thyroid disease Mother   . Skin cancer Mother   . Healthy Father   . Colon cancer Maternal Grandfather   . Thyroid disease Sister     Social History   Social History  . Marital status: Married    Spouse name: N/A  . Number of children: N/A  . Years of education: N/A   Social History Main Topics  . Smoking status: Never Smoker  . Smokeless  tobacco: Never Used  . Alcohol use No  . Drug use: No  . Sexual activity: Yes   Other Topics Concern  . None   Social History Narrative  . None    Review of Systems - See HPI.  All other ROS are negative.  BP 110/72 (BP Location: Left Arm, Patient Position: Sitting, Cuff Size: Normal)   Pulse 83   Temp 98.3 F (36.8 C) (Oral)   Resp 16   Ht 5\' 4"  (1.626 m)   Wt 147 lb (66.7 kg)   LMP 06/14/2016   SpO2 99%   BMI 25.23 kg/m   Physical Exam  Constitutional: She is oriented to person, place, and time and well-developed, well-nourished, and in no distress.  Cardiovascular: Normal rate, regular rhythm, normal heart sounds and intact distal pulses.   Pulmonary/Chest: Effort normal.  Musculoskeletal:       Right knee: Normal. She exhibits normal range of motion. No tenderness found.       Right ankle: Normal. No tenderness.  Neurological: She is alert and oriented to person, place, and time. No cranial nerve deficit.  Sensation of bilateral upper and lower extremities intact.  Sharp/dull differentiation intact.   Skin: Skin is warm and dry. No rash noted.  Vitals reviewed.  Assessment/Plan: 1. Neuropathy (HCC) History negative for trauma or injury. Also negative for diabetes. Questionable history of low b12. Will check  lab assessment today. Referral placed to Neurology per patient request. Declines any mediation at present.  - Basic metabolic panel - A54 - Ambulatory referral to Neurology   Piedad Climes, PA-C

## 2016-07-10 NOTE — Progress Notes (Signed)
Pre visit review using our clinic review tool, if applicable. No additional management support is needed unless otherwise documented below in the visit note/SLS  

## 2016-07-17 DIAGNOSIS — J309 Allergic rhinitis, unspecified: Secondary | ICD-10-CM | POA: Diagnosis not present

## 2016-07-24 ENCOUNTER — Ambulatory Visit (INDEPENDENT_AMBULATORY_CARE_PROVIDER_SITE_OTHER): Payer: BLUE CROSS/BLUE SHIELD | Admitting: Neurology

## 2016-07-24 ENCOUNTER — Encounter: Payer: Self-pay | Admitting: Neurology

## 2016-07-24 VITALS — BP 118/74 | HR 68 | Ht 64.0 in | Wt 147.5 lb

## 2016-07-24 DIAGNOSIS — Z8619 Personal history of other infectious and parasitic diseases: Secondary | ICD-10-CM

## 2016-07-24 DIAGNOSIS — R202 Paresthesia of skin: Secondary | ICD-10-CM | POA: Diagnosis not present

## 2016-07-24 NOTE — Progress Notes (Signed)
Reason for visit: Paresthesias  Referring physician: Dr. Mordecai RasmussenMartin  Kristina Powell is a 40 y.o. female  History of present illness:  Kristina Powell is a 40 year old right-handed [place] female with a history of leprosy at age 89 or 40 years old. The patient was treated with medications with improvement of symptoms. She had right facial numbness around that time as she recalls. She has had some symptoms of numbness that began about 3 years ago. The patient has had increasing frequency of bilateral hand numbness, left greater than right. The numbness will wake her up in the middle the night, she will shake out the hands and the numbness will improve. The patient has noted some right knee and right ankle discomfort with exercise and walking. If she stays off of her feet, this improves. The patient is also noted some occasional numbness between the knee and ankle at times. She denies any significant back pain or pain down the leg. She denies any neck discomfort. She denies issues controlling the bowels or the bladder and she denies any balance problems. She reports no headache or any dizziness problems. She has had some recurrence of her right facial numbness, she is concerned about recurrence of the leprosy. The patient is sent to this office for an evaluation.  Past Medical History:  Diagnosis Date  . Chicken pox   . Hyperthyroidism    x2 years, post childbirth    Past Surgical History:  Procedure Laterality Date  . EYE SURGERY Right 03/2016    Family History  Problem Relation Age of Onset  . Hyperlipidemia Mother   . Thyroid disease Mother   . Skin cancer Mother   . Diabetes Maternal Grandmother   . Healthy Father   . Colon cancer Maternal Grandfather   . Thyroid disease Sister     Social history:  reports that she has never smoked. She has never used smokeless tobacco. She reports that she does not drink alcohol or use drugs.  Medications:  Prior to Admission medications   Medication  Sig Start Date End Date Taking? Authorizing Provider  cholecalciferol (VITAMIN D) 1000 UNITS tablet Take 1,000 Units by mouth daily.   Yes Historical Provider, MD  Difluprednate (DUREZOL) 0.05 % EMUL  05/16/16  Yes Historical Provider, MD  fluticasone (FLONASE) 50 MCG/ACT nasal spray Place 2 sprays into both nostrils daily. Patient taking differently: Place 2 sprays into both nostrils daily as needed.  12/25/15  Yes Waldon MerlWilliam C Martin, PA-C  loteprednol (LOTEMAX) 0.5 % ophthalmic suspension PLACE 1 DROP INTO THE RIGHT EYE AS NEEDED.Marland Kitchen.   Yes Historical Provider, MD  methimazole (TAPAZOLE) 5 MG tablet Take 0.5 tablets (2.5 mg total) by mouth daily. 03/27/16  Yes Carlus Pavlovristina Gherghe, MD  Multiple Vitamin (MULTIVITAMIN) tablet Take 1 tablet by mouth daily.   Yes Historical Provider, MD     No Known Allergies  ROS:  Out of a complete 14 system review of symptoms, the patient complains only of the following symptoms, and all other reviewed systems are negative.  Weight loss, fatigue Itching Blurred vision Cough Urination problems Feeling cold Joint pain, muscle cramps, aching muscles Frequent infections Numbness Anxiety, decreased energy Insomnia  Blood pressure 118/74, pulse 68, height 5\' 4"  (1.626 m), weight 147 lb 8 oz (66.9 kg), last menstrual period 06/14/2016.  Physical Exam  General: The patient is alert and cooperative at the time of the examination.  Eyes: Pupils are equal, round, and reactive to light. Discs are flat bilaterally.  Neck: The  neck is supple, no carotid bruits are noted.  Respiratory: The respiratory examination is clear.  Cardiovascular: The cardiovascular examination reveals a regular rate and rhythm, no obvious murmurs or rubs are noted.  Skin: Extremities are without significant edema.  Neurologic Exam  Mental status: The patient is alert and oriented x 3 at the time of the examination. The patient has apparent normal recent and remote memory, with an  apparently normal attention span and concentration ability.  Cranial nerves: Facial symmetry is present. There is good sensation of the face to pinprick and soft touch bilaterally. The strength of the facial muscles and the muscles to head turning and shoulder shrug are normal bilaterally. Speech is well enunciated, no aphasia or dysarthria is noted. Extraocular movements are full. Visual fields are full. The tongue is midline, and the patient has symmetric elevation of the soft palate. No obvious hearing deficits are noted.  Motor: The motor testing reveals 5 over 5 strength of all 4 extremities. Good symmetric motor tone is noted throughout.  Sensory: Sensory testing is intact to pinprick, soft touch, vibration sensation, and position sense on all 4 extremities. No evidence of extinction is noted.  Coordination: Cerebellar testing reveals good finger-nose-finger and heel-to-shin bilaterally. Tinel's sign at the wrists are positive bilaterally.  Gait and station: Gait is normal. Tandem gait is normal. Romberg is negative. No drift is seen.  Reflexes: Deep tendon reflexes are symmetric and normal bilaterally. Toes are downgoing bilaterally.   Assessment/Plan:  1. Bilateral hand numbness, right leg numbness and pain  The patient likely has mild bilateral carpal tunnel syndrome. She has a prior history of leprosy, she is concerned about recurrence of some numbness of the right face. I will refer her to infectious disease at [facility] with Dr. Polly Cobia. The patient will be set up for nerve conduction studies of both arms and right leg. EMG will be done on the right leg. She will follow-up for the evaluation.  Marlan Palau MD 07/24/2016 3:32 PM  [facility] [address] [place], [place] [address]  Phone [phone] Fax [phone]

## 2016-08-27 DIAGNOSIS — G629 Polyneuropathy, unspecified: Secondary | ICD-10-CM | POA: Diagnosis not present

## 2016-09-19 ENCOUNTER — Encounter: Payer: Self-pay | Admitting: Internal Medicine

## 2016-09-19 ENCOUNTER — Ambulatory Visit (INDEPENDENT_AMBULATORY_CARE_PROVIDER_SITE_OTHER): Payer: BLUE CROSS/BLUE SHIELD | Admitting: Internal Medicine

## 2016-09-19 VITALS — BP 100/70 | HR 85 | Temp 98.2°F | Ht 64.0 in | Wt 154.6 lb

## 2016-09-19 DIAGNOSIS — E0500 Thyrotoxicosis with diffuse goiter without thyrotoxic crisis or storm: Secondary | ICD-10-CM

## 2016-09-19 MED ORDER — METHIMAZOLE 5 MG PO TABS: 2.5000 mg | ORAL_TABLET | Freq: Every day | ORAL | 1 refills | Status: DC

## 2016-09-19 NOTE — Patient Instructions (Signed)
Please have labs today.  Please continue Methimazole 2.5 mg daily.  Please come back for a follow-up appointment in 6 months.

## 2016-09-19 NOTE — Progress Notes (Signed)
Patient ID: Kristina Powell, female   DOB: 07/08/1976, 40 y.o.   MRN: [id number]   HPI  Kristina Powell is a 40 y.o.-year-old female, returning for management of Graves ds. Last visit 8 mo ago. Work Health Provider: Claude Mangesebecca Owens, FNP, RD, LDN  Reviewed hx: Pt was dx with hyperthyroidism in 2005 >> developed this after she had her child >> [place] at the time >> dx with Graves ds. (unclear if she had an uptake and scan then, but likely) >> tx with MMI from 2012-2014 >> stopped 06/2013 >> We had to restart MMI.  Her dose of MMI subsequently decreased to 2.5 mg daily >> Now continues on this dose.  I reviewed pt's thyroid tests: Received labs from PCP, drawn on 06/20/2016: - TSH 0.77 - Free T4 1.5 - Free T3 2.9 Received labs from PCP, drawn on 03/22/2016: - TSH 2.14, free T4 1.3 (0.8-1.8), free T3 2.8 (2.3-4.2) -all normal >> MMI dose decreased to 2.5 mg daily Lab Results  Component Value Date   TSH 0.84 01/22/2016   TSH 1.52 10/23/2015   TSH 2.35 05/23/2015   TSH 0.03 (L) 12/21/2014   TSH 0.05 (L) 09/19/2014   TSH 0.12 (L) 08/03/2014   TSH 0.438 11/06/2013   TSH 0.682 08/28/2013   TSH 1.075 07/17/2013   FREET4 0.97 01/22/2016   FREET4 0.91 05/23/2015   FREET4 2.03 (H) 12/21/2014   FREET4 1.99 (H) 09/19/2014   FREET4 1.95 (H) 08/03/2014   FREET4 1.41 08/28/2013   FREET4 1.47 07/17/2013  02/16/2015: TSH 3.6, free T4 0.9, free T3 2.5 - decreased the methimazole from 10 mg 3 times a day to only 2 times a day. 11/24/2013: TSH 0.86, fT4 1.4, TSI 68 (<140%) 07/02/2012: TSH 3.18, TT4 9.4 (4.5-12) 04/14/2012: TSH 1.81  Component     Latest Ref Rng 09/19/2014  TSI     <140 % baseline 219 (H)   Pt denies feeling nodules in neck, hoarseness, dysphagia/odynophagia, SOB with lying down: - no excessive sweating/heat intolerance - no weight issues - no fatigue - no poor sleep - no tremors - no anxiety - no palpitations - no problems with concentration - no  hyperdefecation  She sees ophthalmology (Dr. Burgess Estelleanner) - 10/17/2014. Has eye drops. Has blurry vision.  She will have pterygium sx in July.  ROS: Constitutional: see HPI Eyes: no blurry vision, no xerophthalmia ENT: no sore throat, no nodules palpated in throat, no dysphagia/odynophagia, no hoarseness Cardiovascular: no CP/SOB/palpitations/leg swelling Respiratory: no cough/SOB Gastrointestinal: no N/V/D/C/acid reflux Musculoskeletal: no muscle/no joint aches Skin: no rashes Neurological: no tremors/numbness/tingling/dizziness  I reviewed pt's medications, allergies, PMH, social hx, family hx, and changes were documented in the history of present illness. Otherwise, unchanged from my initial visit note.  Past Medical History:  Diagnosis Date  . Chicken pox   . History of leprosy 07/24/2016  . Hyperthyroidism    x2 years, post childbirth   Past Surgical History:  Procedure Laterality Date  . EYE SURGERY Right 03/2016   History   Social History  . Marital Status: Married    Spouse Name: N/A    Number of Children: 1   Occupational History  . Credit analyst   Social History Main Topics  . Smoking status: Never Smoker   . Smokeless tobacco: No  . Alcohol Use: No  . Drug Use: No   Social History Narrative   Current Outpatient Prescriptions on File Prior to Visit  Medication Sig Dispense Refill  .  cholecalciferol (VITAMIN D) 1000 UNITS tablet Take 1,000 Units by mouth daily.    . Difluprednate (DUREZOL) 0.05 % EMUL     . loteprednol (LOTEMAX) 0.5 % ophthalmic suspension PLACE 1 DROP INTO THE RIGHT EYE AS NEEDED..    . methimazole (TAPAZOLE) 5 MG tablet Take 0.5 tablets (2.5 mg total) by mouth daily. 90 tablet 1  . fluticasone (FLONASE) 50 MCG/ACT nasal spray Place 2 sprays into both nostrils daily. (Patient not taking: Reported on 09/19/2016) 16 g 6   No current facility-administered medications on file prior to visit.    No Known Allergies Family History  Problem  Relation Age of Onset  . Hyperlipidemia Mother   . Thyroid disease Mother   . Skin cancer Mother   . Diabetes Maternal Grandmother   . Healthy Father   . Colon cancer Maternal Grandfather   . Thyroid disease Sister    PE: Pulse 85   Temp 98.2 F (36.8 C) (Oral)   Ht 5\' 4"  (1.626 m)   Wt 154 lb 9.6 oz (70.1 kg)   LMP 09/05/2016   SpO2 98%   BMI 26.54 kg/m  Body mass index is 26.54 kg/m.  Wt Readings from Last 3 Encounters:  09/19/16 154 lb 9.6 oz (70.1 kg)  07/24/16 147 lb 8 oz (66.9 kg)  07/10/16 147 lb (66.7 kg)   Constitutional: slightly overweight, in NAD Eyes: PERRLA, EOMI, no exophthalmos, no lid lag, no stare; + pterygium in L eye ENT: moist mucous membranes, + thyromegaly L>R, no cervical lymphadenopathy Cardiovascular: RRR, No MRG Respiratory: CTA B Gastrointestinal: abdomen soft, NT, ND, BS+ Musculoskeletal: no deformities, strength intact in all 4 Skin: moist, warm, no rashes Neurological: no tremor with outstretched hands, DTR +3/5 in  all 4  ASSESSMENT: 1. Graves disease  PLAN:  1. Patient with h/o Graves ds, on tx with MMI, initially with thyrotoxic sxs, now improved/resolved on MMI, but reappeared after stopping MMI. We restarted MMI and we are decreasing the dose slowly. Now on MMI 2.5 mg daily >> will continue. - She is asymptomatic - She knows to stop the Methimazole (Tapazole) and call us or your primary care doctor if: - sore throat - fever - yellow skin - dark urine - light colored stools - No immediate plans for pregnancy. - refilled MMI - RTC in 8 months, but likely sooner for repeat labs  Pt prefers to get labs at work (lab of [employer]) >> fax. [phone]  Orders Placed This Encounter  Procedures  . T4, free  . T3, free  . TSH   I will addend the results when they become available. Carlus Pavlovristina Chanz Cahall, MD PhD [facility]

## 2016-10-02 ENCOUNTER — Encounter: Payer: BLUE CROSS/BLUE SHIELD | Admitting: Neurology

## 2016-10-24 DIAGNOSIS — Z0000 Encounter for general adult medical examination without abnormal findings: Secondary | ICD-10-CM | POA: Diagnosis not present

## 2016-10-24 LAB — TSH: TSH: 0.85 u[IU]/mL (ref <=5.90)

## 2016-11-12 ENCOUNTER — Encounter (HOSPITAL_COMMUNITY): Payer: Self-pay | Admitting: Emergency Medicine

## 2016-11-12 ENCOUNTER — Ambulatory Visit (HOSPITAL_COMMUNITY)
Admission: EM | Admit: 2016-11-12 | Discharge: 2016-11-12 | Disposition: A | Discharge status: Home / Self Care | Payer: BLUE CROSS/BLUE SHIELD | Attending: Family Medicine | Admitting: Family Medicine

## 2016-11-12 ENCOUNTER — Telehealth: Payer: Self-pay | Admitting: Physician Assistant

## 2016-11-12 DIAGNOSIS — K2950 Unspecified chronic gastritis without bleeding: Secondary | ICD-10-CM | POA: Diagnosis not present

## 2016-11-12 DIAGNOSIS — R1013 Epigastric pain: Secondary | ICD-10-CM | POA: Diagnosis not present

## 2016-11-12 DIAGNOSIS — G8929 Other chronic pain: Secondary | ICD-10-CM | POA: Diagnosis not present

## 2016-11-12 MED ORDER — ESOMEPRAZOLE MAGNESIUM 40 MG PO CPDR: 40.0000 mg | DELAYED_RELEASE_CAPSULE | Freq: Every day | ORAL | 1 refills | Status: DC

## 2016-11-12 MED ORDER — RANITIDINE HCL 150 MG PO CAPS: 150.0000 mg | ORAL_CAPSULE | Freq: Two times a day (BID) | ORAL | 1 refills | Status: DC

## 2016-11-12 MED ORDER — SUCRALFATE 1 G PO TABS: 1.0000 g | ORAL_TABLET | Freq: Three times a day (TID) | ORAL | 1 refills | Status: DC

## 2016-11-12 NOTE — Discharge Instructions (Signed)
Read the directions to help with food that she can eat with your stomach problems. Take the medications as written and discussed. Call the gastroenterologist today to make an appointment and follow-up. If you get worse, the pain is worse, if you see bleeding, fever, chills, vomiting can you need to go to the emergency department.

## 2016-11-12 NOTE — Telephone Encounter (Signed)
Please follow-up to make sure patient has gone to be seen.

## 2016-11-12 NOTE — ED Provider Notes (Signed)
CSN: [id number]     Arrival date & time 11/12/16  1001 History   First MD Initiated Contact with Patient 11/12/16 1143     Chief Complaint  Patient presents with  . Abdominal Pain   (Consider location/radiation/quality/duration/timing/severity/associated sxs/prior Treatment) HPI  Past Medical History:  Diagnosis Date  . Chicken pox   . History of leprosy 07/24/2016  . Hyperthyroidism    x2 years, post childbirth   Past Surgical History:  Procedure Laterality Date  . EYE SURGERY Right 03/2016   Family History  Problem Relation Age of Onset  . Hyperlipidemia Mother   . Thyroid disease Mother   . Skin cancer Mother   . Diabetes Maternal Grandmother   . Healthy Father   . Colon cancer Maternal Grandfather   . Thyroid disease Sister    Social History  Substance Use Topics  . Smoking status: Never Smoker  . Smokeless tobacco: Never Used  . Alcohol use No   OB History    No data available     Review of Systems  Allergies  Patient has no known allergies.  Home Medications   Prior to Admission medications   Medication Sig Start Date End Date Taking? Authorizing Provider  cholecalciferol (VITAMIN D) 1000 UNITS tablet Take 1,000 Units by mouth daily.   Yes Historical Provider, MD  methimazole (TAPAZOLE) 5 MG tablet Take 0.5 tablets (2.5 mg total) by mouth daily. 09/19/16  Yes Carlus Pavlov, MD  omeprazole (PRILOSEC) 20 MG capsule Take 20 mg by mouth daily.   Yes Historical Provider, MD  esomeprazole (NEXIUM) 40 MG capsule Take 1 capsule (40 mg total) by mouth daily. 11/12/16   Hayden Rasmussen, NP  ranitidine (ZANTAC) 150 MG capsule Take 1 capsule (150 mg total) by mouth 2 (two) times daily. 11/12/16   Hayden Rasmussen, NP  sucralfate (CARAFATE) 1 g tablet Take 1 tablet (1 g total) by mouth 4 (four) times daily -  with meals and at bedtime. 11/12/16   Hayden Rasmussen, NP   Meds Ordered and Administered this Visit  Medications - No data to display  BP 101/59 (BP Location: Right Arm)    Pulse 85   Temp 97.6 F (36.4 C) (Oral)   Resp 18   SpO2 99%  No data found.   Physical Exam  Urgent Care Course     Procedures (including critical care time)  Labs Review Labs Reviewed - No data to display  Imaging Review No results found.   Visual Acuity Review  Right Eye Distance:   Left Eye Distance:   Bilateral Distance:    Right Eye Near:   Left Eye Near:    Bilateral Near:         MDM   1. Chronic gastritis without bleeding, unspecified gastritis type   2. Chronic epigastric pain    Read the directions to help with food that she can eat with your stomach problems. Take the medications as written and discussed. Call the gastroenterologist today to make an appointment and follow-up. If you get worse, the pain is worse, if you see bleeding, fever, chills, vomiting can you need to go to the emergency department. Meds ordered this encounter  Medications  . omeprazole (PRILOSEC) 20 MG capsule    Sig: Take 20 mg by mouth daily.  Marland Kitchen esomeprazole (NEXIUM) 40 MG capsule    Sig: Take 1 capsule (40 mg total) by mouth daily.    Dispense:  30 capsule    Refill:  1  Order Specific Question:   Supervising Provider    Answer:   Linna HoffKINDL, JAMES D [phone][[id number]]  . sucralfate (CARAFATE) 1 g tablet    Sig: Take 1 tablet (1 g total) by mouth 4 (four) times daily -  with meals and at bedtime.    Dispense:  120 tablet    Refill:  1    Order Specific Question:   Supervising Provider    Answer:   Linna HoffKINDL, JAMES D [phone][[id number]]  . ranitidine (ZANTAC) 150 MG capsule    Sig: Take 1 capsule (150 mg total) by mouth 2 (two) times daily.    Dispense:  60 capsule    Refill:  1    Order Specific Question:   Supervising Provider    Answer:   Linna HoffKINDL, JAMES D [[id number]]       Hayden Rasmussenavid Daaiel Starlin, NP 11/12/16 1208

## 2016-11-12 NOTE — ED Triage Notes (Signed)
The [facility] with a complaint of upper abdominal pain that started 5 days. The patient reported a hx of ulcers and gastritis.

## 2016-11-12 NOTE — Telephone Encounter (Signed)
FYI-Patient evaluated at [facility] today for Gastritis. Patient to f/u with G.I.

## 2016-11-12 NOTE — Telephone Encounter (Signed)
[facility] [place] Day - Cli TELEPHONE ADVICE RECORD [facility] Patient Name: Kristina CroftsKELLY Yim DOB: 01/09/1976 Initial Comment Caller states she is having severe abdominal pain. The office told her to go to the ER. Nurse Assessment Nurse: Debera Latalston, RN, Tinnie GensJeffrey Date/Time Lamount Cohen(Eastern Time): 11/12/2016 8:40:34 AM Confirm and document reason for call. If symptomatic, describe symptoms. ---Caller states she is having severe abdominal pain. The office told her to go to the ER. Having stomach cramps for a week. No vomiting or diarrhea. Pain is 6-7 on 0-10 scale. Constant pain since last night. Does the patient have any new or worsening symptoms? ---Yes Will a triage be completed? ---Yes Related visit to physician within the last 2 weeks? ---No Does the PT have any chronic conditions? (i.e. diabetes, asthma, etc.) ---No Is the patient pregnant or possibly pregnant? (Ask all females between the ages of 2212-55) ---No Is this a behavioral health or substance abuse call? ---No Guidelines Guideline Title Affirmed Question Affirmed Notes Abdominal Pain - Upper [1] MILD-MODERATE pain AND [2] constant AND [3] present > 2 hours Final Disposition User See Physician within 4 Hours (or PCP triage) Debera Latalston, RN, Tinnie GensJeffrey Comments [facility]. Referrals GO TO FACILITY OTHER - SPECIFY Disagree/Comply: Comply

## 2016-11-13 ENCOUNTER — Encounter: Payer: Self-pay | Admitting: Gastroenterology

## 2016-11-13 ENCOUNTER — Telehealth: Payer: Self-pay

## 2016-11-13 NOTE — Telephone Encounter (Signed)
Called and notified patient we had received her lab work, Dr.Gherghe looked over them and they were great. Sent paper work to be scanned.

## 2016-11-19 ENCOUNTER — Ambulatory Visit (INDEPENDENT_AMBULATORY_CARE_PROVIDER_SITE_OTHER): Payer: BLUE CROSS/BLUE SHIELD | Admitting: Gastroenterology

## 2016-11-19 ENCOUNTER — Encounter: Payer: Self-pay | Admitting: Gastroenterology

## 2016-11-19 VITALS — BP 92/66 | HR 84 | Ht 64.0 in | Wt 151.2 lb

## 2016-11-19 DIAGNOSIS — Z8719 Personal history of other diseases of the digestive system: Secondary | ICD-10-CM

## 2016-11-19 DIAGNOSIS — Z8711 Personal history of peptic ulcer disease: Secondary | ICD-10-CM

## 2016-11-19 DIAGNOSIS — R1013 Epigastric pain: Secondary | ICD-10-CM

## 2016-11-19 NOTE — Patient Instructions (Signed)

## 2016-11-19 NOTE — Progress Notes (Signed)
Kristina CroftsKelly Mcnairy    [id number]    07/20/1976  Primary Care Physician:Martin, Sherley BoundsWilliam Cody, PA-C  Referring Physician: Waldon MerlWilliam C Martin, PA-C [address] [place], [place] [id number]  Chief complaint:  Epigastric abd pain, bloating, heartburn, acid reflux  HPI: [age] yr F here for new patient evaluation with c/o epigastric pain. She went to urgent care last week with severe epigastric abdominal pain. She was prescribed Nexium 40 mg daily, Zantac 150 mg twice daily and Carafate 1 g tablets 4 times daily with meals and bedtime. Patient reports some improvement with the medications. She also complains of feeling bloated immediately after eating. She had gastric ulcers when she was a teenager and was admitted to the hospital after she threw up blood. Her husband was diagnosed with H. pylori gastritis and was treated for it a few years ago. She had EGD and colonoscopy about 10-15 years ago in [place], both were normal per patient, report is not available to review during this visit. She doesn't recall if she was tested for H. pylori or was treated in the past. She noticed dark stool in the past 1 week since she started taking sucralfate. Denies any nausea, vomiting, hematemesis or blood per rectum. She is avoiding caffeine, chocolate, greasy or fried foods   Outpatient Encounter Prescriptions as of 11/19/2016  Medication Sig  . cholecalciferol (VITAMIN D) 1000 UNITS tablet Take 1,000 Units by mouth daily.  Marland Kitchen. esomeprazole (NEXIUM) 40 MG capsule Take 1 capsule (40 mg total) by mouth daily.  . methimazole (TAPAZOLE) 5 MG tablet Take 0.5 tablets (2.5 mg total) by mouth daily.  Marland Kitchen. omeprazole (PRILOSEC) 20 MG capsule Take 20 mg by mouth daily.  . ranitidine (ZANTAC) 150 MG capsule Take 1 capsule (150 mg total) by mouth 2 (two) times daily.  . sucralfate (CARAFATE) 1 g tablet Take 1 tablet (1 g total) by mouth 4 (four) times daily -  with meals and at bedtime.   No facility-administered  encounter medications on file as of 11/19/2016.     Allergies as of 11/19/2016  . (No Known Allergies)    Past Medical History:  Diagnosis Date  . Chicken pox   . History of leprosy 07/24/2016  . Hyperthyroidism    x2 years, post childbirth    Past Surgical History:  Procedure Laterality Date  . EYE SURGERY Right 03/2016    Family History  Problem Relation Age of Onset  . Hyperlipidemia Mother   . Thyroid disease Mother   . Skin cancer Mother   . Diabetes Maternal Grandmother   . Healthy Father   . Colon cancer Maternal Grandfather   . Thyroid disease Sister     Social History   Social History  . Marital status: Married    Spouse name: N/A  . Number of children: 1  . Years of education: 3416   Occupational History  . [employer]    Social History Main Topics  . Smoking status: Never Smoker  . Smokeless tobacco: Never Used  . Alcohol use No  . Drug use: No  . Sexual activity: Yes   Other Topics Concern  . Not on file   Social History Narrative   Lives at home w/ her husband and son   Right-handed   Caffeine: 2-3 cups of coffee in the morning      Review of systems: Review of Systems  Constitutional: Negative for fever and chills.  HENT: Negative.  Eyes: Negative for blurred vision.  Respiratory: Negative for cough, shortness of breath and wheezing.   Cardiovascular: Negative for chest pain and palpitations.  Gastrointestinal: as per HPI Genitourinary: Negative for dysuria, urgency, frequency and hematuria.  Musculoskeletal: Negative for myalgias, back pain and joint pain.  Skin: Negative for itching and rash.  Neurological: Negative for dizziness, tremors, focal weakness, seizures and loss of consciousness.  Endo/Heme/Allergies: Positive for seasonal allergies.  Psychiatric/Behavioral: Negative for depression, suicidal ideas and hallucinations.  All other systems reviewed and are negative.   Physical Exam: Vitals:   11/19/16 1504  BP: 92/66    Pulse: 84   Body mass index is 25.95 kg/m. Gen:      No acute distress HEENT:  EOMI, sclera anicteric Neck:     No masses; no thyromegaly Lungs:    Clear to auscultation bilaterally; normal respiratory effort CV:         Regular rate and rhythm; no murmurs Abd:      + bowel sounds; soft, non-tender; no palpable masses, no distension Ext:    No edema; adequate peripheral perfusion Skin:      Warm and dry; no rash Neuro: alert and oriented x 3 Psych: normal mood and affect  Data Reviewed:  Reviewed labs, radiology imaging, old records and pertinent past GI work up   Assessment and Plan/Recommendations:  41 year old female with history of Graves' disease, gastric ulcer in the remote past here with complaints of severe epigastric abdominal pain for past few weeks We will schedule for EGD given history of gastric ulcer to further evaluate, also we'll need to check for H. pylori status with gastric antral and body biopsies The risks and benefits as well as alternatives of endoscopic procedure(s) have been discussed and reviewed. All questions answered. The patient agrees to proceed. Continue PPI once daily, 30 minutes before breakfast Advised patient to take ranitidine only at bedtime Okay to use sucralfate 1 g with meals and at bedtime as needed   Greater than 50% of the time used for counseling as well as treatment plan and follow-up. She had multiple questions which were answered to her satisfaction  K. Scherry Ran , MD [phone] Mon-Fri 8a-5p [phone] after 5p, weekends, holidays  CC: Waldon Merl, [place]

## 2016-11-20 DIAGNOSIS — Z0000 Encounter for general adult medical examination without abnormal findings: Secondary | ICD-10-CM | POA: Diagnosis not present

## 2016-11-21 ENCOUNTER — Telehealth: Payer: Self-pay | Admitting: *Deleted

## 2016-11-21 NOTE — Telephone Encounter (Signed)
===  View-only below this line=== FYI Dr Lavon PaganiniNandigam ----- Message ----- From: Libby MawAmy L Hazelwood Sent: 11/21/2016  10:25 AM To: Marlowe Kaysobin M Rayley Gao, CMA Subject: RE: Prior approval and cost                    Just fyi, she cancelled because of cost. Thanks Amy ----- Message ----- From: Marlowe Kaysobin M Aviv Rota, CMA Sent: 11/19/2016   4:50 PM To: Amy L Hazelwood Subject: RE: Prior approval and cost                    Ok your the best ----- Message ----- From: Libby MawAmy L Hazelwood Sent: 11/19/2016   4:21 PM To: Marlowe Kaysobin M Eleftheria Taborn, CMA Subject: RE: Prior approval and cost                    Ok, I usually dont process until 2 weeks before procedure because pt can loose insurance coverage between now and march 15th.  I will call her tomorrow and let her know. Thanks, Amy ----- Message ----- From: Marlowe Kaysobin M Shane Melby, CMA Sent: 11/19/2016   4:16 PM To: Amy L Hazelwood Subject: Prior approval and cost                        This patient has concerns about how much she will be billed  I told her to contact her insurance but I dont think she understands , When you do precert can you call her  Thanks

## 2016-11-21 NOTE — Telephone Encounter (Signed)
ok 

## 2016-12-12 ENCOUNTER — Encounter: Payer: Self-pay | Admitting: Gastroenterology

## 2016-12-18 ENCOUNTER — Ambulatory Visit (AMBULATORY_SURGERY_CENTER): Payer: BLUE CROSS/BLUE SHIELD | Admitting: Gastroenterology

## 2016-12-18 ENCOUNTER — Encounter: Payer: Self-pay | Admitting: Gastroenterology

## 2016-12-18 VITALS — BP 115/68 | HR 63 | Temp 98.6°F | Resp 36 | Ht 64.0 in | Wt 153.0 lb

## 2016-12-18 DIAGNOSIS — K2971 Gastritis, unspecified, with bleeding: Secondary | ICD-10-CM | POA: Diagnosis not present

## 2016-12-18 DIAGNOSIS — R1013 Epigastric pain: Secondary | ICD-10-CM | POA: Diagnosis not present

## 2016-12-18 MED ORDER — SODIUM CHLORIDE 0.9 % IV SOLN: 500.0000 mL | INTRAVENOUS | Status: AC

## 2016-12-18 NOTE — Patient Instructions (Signed)
YOU HAD AN ENDOSCOPIC PROCEDURE TODAY AT THE  ENDOSCOPY CENTER:   Refer to the procedure report that was given to you for any specific questions about what was found during the examination.  If the procedure report does not answer your questions, please call your gastroenterologist to clarify.  If you requested that your care partner not be given the details of your procedure findings, then the procedure report has been included in a sealed envelope for you to review at your convenience later.  YOU SHOULD EXPECT: Some feelings of bloating in the abdomen. Passage of more gas than usual.  Walking can help get rid of the air that was put into your GI tract during the procedure and reduce the bloating. If you had a lower endoscopy (such as a colonoscopy or flexible sigmoidoscopy) you may notice spotting of blood in your stool or on the toilet paper. If you underwent a bowel prep for your procedure, you may not have a normal bowel movement for a few days.  Please Note:  You might notice some irritation and congestion in your nose or some drainage.  This is from the oxygen used during your procedure.  There is no need for concern and it should clear up in a day or so.  SYMPTOMS TO REPORT IMMEDIATELY:    Following upper endoscopy (EGD)  Vomiting of blood or coffee ground material  New chest pain or pain under the shoulder blades  Painful or persistently difficult swallowing  New shortness of breath  Fever of 100F or higher  Black, tarry-looking stools  For urgent or emergent issues, a gastroenterologist can be reached at any hour by calling [phone].   DIET:  We do recommend a small meal at first, but then you may proceed to your regular diet.  Drink plenty of fluids but you should avoid alcoholic beverages for 24 hours.  ACTIVITY:  You should plan to take it easy for the rest of today and you should NOT DRIVE or use heavy machinery until tomorrow (because of the sedation medicines used  during the test).    FOLLOW UP: Our staff will call the number listed on your records the next business day following your procedure to check on you and address any questions or concerns that you may have regarding the information given to you following your procedure. If we do not reach you, we will leave a message.  However, if you are feeling well and you are not experiencing any problems, there is no need to return our call.  We will assume that you have returned to your regular daily activities without incident.  If any biopsies were taken you will be contacted by phone or by letter within the next 1-3 weeks.  Please call us at [phone] if you have not heard about the biopsies in 3 weeks.    SIGNATURES/CONFIDENTIALITY: You and/or your care partner have signed paperwork which will be entered into your electronic medical record.  These signatures attest to the fact that that the information above on your After Visit Summary has been reviewed and is understood.  Full responsibility of the confidentiality of this discharge information lies with you and/or your care-partner.    Handout was given to your care partner on gastritis. No aspirin, ibuprofen, naproxen, or other non-steroidal anti-inflammatory drugs. Please follow Dr. Elana AlmNandigam's recommendations for meds on her procedure report.. Await biopsy results. Please call if any questions or concerns.

## 2016-12-18 NOTE — Progress Notes (Signed)
Darlyn Readelia McCoy, RN went over discharge instructions with pt and her care partner.  Maw  No problems noted in the recovery room. maw

## 2016-12-18 NOTE — Progress Notes (Signed)
Called to room to assist during endoscopic procedure.  Patient ID and intended procedure confirmed with present staff. Received instructions for my participation in the procedure from the performing physician.  

## 2016-12-18 NOTE — Op Note (Addendum)
[facility] Patient Name: Vladimir CroftsKelly Man Procedure Date: 12/18/2016 10:22 AM MRN: [id number] Endoscopist: Napoleon FormKavitha V. Christeen Lai , MD Age: [age] Referring MD:  Date of Birth: 07/27/1976 Gender: Female Account #: [id number] Procedure:                Upper GI endoscopy Indications:              Epigastric abdominal pain, Dyspepsia Medicines:                Monitored Anesthesia Care Procedure:                Pre-Anesthesia Assessment:                           - Prior to the procedure, a History and Physical                            was performed, and patient medications and                            allergies were reviewed. The patient's tolerance of                            previous anesthesia was also reviewed. The risks                            and benefits of the procedure and the sedation                            options and risks were discussed with the patient.                            All questions were answered, and informed consent                            was obtained. Prior Anticoagulants: The patient has                            taken no previous anticoagulant or antiplatelet                            agents. ASA Grade Assessment: II - A patient with                            mild systemic disease. After reviewing the risks                            and benefits, the patient was deemed in                            satisfactory condition to undergo the procedure.                           After obtaining informed consent, the endoscope was  passed under direct vision. Throughout the                            procedure, the patient's blood pressure, pulse, and                            oxygen saturations were monitored continuously. The                            Model GIF-[id number]) scope was introduced                            through the mouth, and advanced to the second part                            of duodenum.  The upper GI endoscopy was                            accomplished without difficulty. The patient                            tolerated the procedure well. Scope In: Scope Out: Findings:                 The esophagus was normal.                           Scattered mild inflammation with hemorrhage                            characterized by congestion (edema), erythema and                            friability was found in the gastric antrum.                            Biopsies were taken with a cold forceps for                            histology. Biopsies were taken with a cold forceps                            for Helicobacter pylori testing.                           The examined duodenum was normal. Complications:            No immediate complications. Estimated Blood Loss:     Estimated blood loss was minimal. Impression:               - Normal esophagus.                           - Gastritis with hemorrhage. Biopsied.                           - Normal examined  duodenum. Recommendation:           - Patient has a contact number available for                            emergencies. The signs and symptoms of potential                            delayed complications were discussed with the                            patient. Return to normal activities tomorrow.                            Written discharge instructions were provided to the                            patient.                           - Resume previous diet.                           - Continue present medications.                           - Nexium 40 mg daily, 30 minutes before breakfast                           - Ranitidine 150mg  at bedtime as needed                           - Can discontinue Sucralfate                           - Await pathology results.                           - No aspirin, ibuprofen, naproxen, or other                            non-steroidal anti-inflammatory drugs.                            - No repeat upper endoscopy.                           - Return to GI office PRN.                           - Refer to Nutrition to discuss healthy diet Napoleon Form, MD 12/18/2016 10:44:45 AM This report has been signed electronically.

## 2016-12-18 NOTE — Progress Notes (Signed)
To recovery, report to Willis, RN, VSS 

## 2016-12-19 ENCOUNTER — Telehealth: Payer: Self-pay | Admitting: *Deleted

## 2016-12-19 NOTE — Telephone Encounter (Signed)
No answer. Number identifier. Message left to call if questions or concerns and we would make another attempt to call later today.

## 2016-12-19 NOTE — Telephone Encounter (Deleted)
  Follow up Call-  Call back number 12/18/2016  Post procedure Call Back phone  # [phone]  Permission to leave phone message Yes  Some recent data might be hidden     Patient questions:  Do you have a fever, pain , or abdominal swelling? {yes no:314532} Pain Score  {NUMBERS; 0-10:5044} *  Have you tolerated food without any problems? {yes no:314532}  Have you been able to return to your normal activities? {yes no:314532}  Do you have any questions about your discharge instructions: Diet   {yes no:314532} Medications  {yes no:314532} Follow up visit  {yes no:314532}  Do you have questions or concerns about your Care? {yes no:314532}  Actions: * If pain score is 4 or above: {ACTION; [facility] ENDO PAIN >4:21563::"No action needed, pain <4."}

## 2016-12-19 NOTE — Telephone Encounter (Signed)
  Follow up Call-  Call back number 12/18/2016  Post procedure Call Back phone  # [phone]  Permission to leave phone message Yes  Some recent data might be hidden    [phone] Patient questions:  Do you have a fever, pain , or abdominal swelling? No. Pain Score  0 *  Have you tolerated food without any problems? Yes.    Have you been able to return to your normal activities? Yes.    Do you have any questions about your discharge instructions: Diet   No. Medications  No. Follow up visit  No.  Do you have questions or concerns about your Care? No.  Actions: * If pain score is 4 or above: No action needed, pain <4.

## 2016-12-30 ENCOUNTER — Encounter: Payer: Self-pay | Admitting: Gastroenterology

## 2017-01-21 DIAGNOSIS — Z1231 Encounter for screening mammogram for malignant neoplasm of breast: Secondary | ICD-10-CM | POA: Diagnosis not present

## 2017-02-26 DIAGNOSIS — H5213 Myopia, bilateral: Secondary | ICD-10-CM | POA: Diagnosis not present

## 2017-02-26 DIAGNOSIS — H04123 Dry eye syndrome of bilateral lacrimal glands: Secondary | ICD-10-CM | POA: Diagnosis not present

## 2017-02-26 DIAGNOSIS — H1789 Other corneal scars and opacities: Secondary | ICD-10-CM | POA: Diagnosis not present

## 2017-03-17 DIAGNOSIS — H6503 Acute serous otitis media, bilateral: Secondary | ICD-10-CM | POA: Diagnosis not present

## 2017-03-17 DIAGNOSIS — J309 Allergic rhinitis, unspecified: Secondary | ICD-10-CM | POA: Diagnosis not present

## 2017-03-20 DIAGNOSIS — N644 Mastodynia: Secondary | ICD-10-CM | POA: Diagnosis not present

## 2017-03-20 DIAGNOSIS — R928 Other abnormal and inconclusive findings on diagnostic imaging of breast: Secondary | ICD-10-CM | POA: Diagnosis not present

## 2017-04-07 DIAGNOSIS — R922 Inconclusive mammogram: Secondary | ICD-10-CM | POA: Diagnosis not present

## 2017-04-07 DIAGNOSIS — N644 Mastodynia: Secondary | ICD-10-CM | POA: Diagnosis not present

## 2017-04-07 DIAGNOSIS — N6091 Unspecified benign mammary dysplasia of right breast: Secondary | ICD-10-CM | POA: Diagnosis not present

## 2017-05-02 DIAGNOSIS — M545 Low back pain: Secondary | ICD-10-CM | POA: Diagnosis not present

## 2017-05-08 ENCOUNTER — Encounter: Payer: Self-pay | Admitting: Internal Medicine

## 2017-05-08 ENCOUNTER — Ambulatory Visit (INDEPENDENT_AMBULATORY_CARE_PROVIDER_SITE_OTHER): Payer: BLUE CROSS/BLUE SHIELD | Admitting: Internal Medicine

## 2017-05-08 VITALS — BP 110/64 | HR 78 | Wt 153.0 lb

## 2017-05-08 DIAGNOSIS — E049 Nontoxic goiter, unspecified: Secondary | ICD-10-CM | POA: Diagnosis not present

## 2017-05-08 DIAGNOSIS — E0500 Thyrotoxicosis with diffuse goiter without thyrotoxic crisis or storm: Secondary | ICD-10-CM

## 2017-05-08 NOTE — Progress Notes (Signed)
Patient ID: Virgina Deakins, female   DOB: June 29, 1976, 41 y.o.   MRN: [id number]   HPI  Brenlynn Fake is a 41 y.o.-year-old female, returning for management of Graves ds. Last visit 8 mo ago. Work Health Provider: Claude Manges, FNP, RD, LDN  She was dx'ed with gastritis 3 mo ago >> was off MMI since then >> asymptomatic. She restarted the med last week.  Reviewed and addended hx: Pt was dx with hyperthyroidism in 2005 >> developed this after she had her child >> [place] PCP in [place] at the time >> dx with Graves ds. (unclear if she had an uptake and scan then, but likely) >> tx with MMI from 2012-2014 >> stopped 06/2013 >> We had to restart MMI in 2015.  We were able to decrease her MMi dose to 2.5 mg daily >> now continues on this dose.  I reviewed pt's thyroid tests: Lab Results  Component Value Date   TSH 0.85 10/24/2016   TSH 0.84 01/22/2016   TSH 1.52 10/23/2015   TSH 2.35 05/23/2015   TSH 0.03 (L) 12/21/2014   TSH 0.05 (L) 09/19/2014   TSH 0.12 (L) 08/03/2014   TSH 0.438 11/06/2013   TSH 0.682 08/28/2013   TSH 1.075 07/17/2013   FREET4 0.97 01/22/2016   FREET4 0.91 05/23/2015   FREET4 2.03 (H) 12/21/2014   FREET4 1.99 (H) 09/19/2014   FREET4 1.95 (H) 08/03/2014   FREET4 1.41 08/28/2013   FREET4 1.47 07/17/2013  06/20/2016: TSH 0.77, Free T4 1.5, Free T3 2.9 03/22/2016: TSH 2.14, free T4 1.3 (0.8-1.8), free T3 2.8 (2.3-4.2) -all normal >> MMI dose decreased to 2.5 mg daily 02/16/2015: TSH 3.6, free T4 0.9, free T3 2.5 - decreased the methimazole from 10 mg 3 times a day to only 2 times a day. 11/24/2013: TSH 0.86, fT4 1.4, TSI 68 (<140%) 07/02/2012: TSH 3.18, TT4 9.4 (4.5-12) 04/14/2012: TSH 1.81  Lab Results  Component Value Date   TSI 219 (H) 09/19/2014  11/24/2013: TSI 68 (<140%)  Pt denies: - feeling nodules in neck - hoarseness - dysphagia - choking - SOB with lying down  She sees ophthalmology (Dr. Burgess Estelle) - 10/17/2014. Has eye drops. Blurry vision  improved.  She will have pterygium sx in July.  ROS: Constitutional: no weight gain/no weight loss, no fatigue, no subjective hyperthermia, no subjective hypothermia Eyes: no blurry vision, no xerophthalmia ENT: no sore throat, no nodules palpated in throat, no dysphagia, no odynophagia, no hoarseness Cardiovascular: no CP/no SOB/no palpitations/no leg swelling Respiratory: no cough/no SOB/no wheezing Gastrointestinal: no N/no V/no D/no C/no acid reflux Musculoskeletal: no muscle aches/no joint aches Skin: no rashes, no hair loss Neurological: no tremors/no numbness/no tingling/no dizziness  I reviewed pt's medications, allergies, PMH, social hx, family hx, and changes were documented in the history of present illness. Otherwise, unchanged from my initial visit note.  Past Medical History:  Diagnosis Date  . Chicken pox   . History of leprosy 07/24/2016  . Hyperthyroidism    x2 years, post childbirth   Past Surgical History:  Procedure Laterality Date  . EYE SURGERY Right 03/2016   History   Social History  . Marital Status: Married    Spouse Name: N/A    Number of Children: 1   Occupational History  . Credit analyst   Social History Main Topics  . Smoking status: Never Smoker   . Smokeless tobacco: No  . Alcohol Use: No  . Drug Use: No   Social History Narrative  Current Outpatient Prescriptions on File Prior to Visit  Medication Sig Dispense Refill  . cholecalciferol (VITAMIN D) 1000 UNITS tablet Take 1,000 Units by mouth daily.    . clobetasol cream (TEMOVATE) 0.05 %   2  . esomeprazole (NEXIUM) 40 MG capsule Take 1 capsule (40 mg total) by mouth daily. 30 capsule 1  . methimazole (TAPAZOLE) 5 MG tablet Take 0.5 tablets (2.5 mg total) by mouth daily. 90 tablet 1  . ranitidine (ZANTAC) 150 MG capsule Take 1 capsule (150 mg total) by mouth 2 (two) times daily. 60 capsule 1   Current Facility-Administered Medications on File Prior to Visit  Medication Dose  Route Frequency Provider Last Rate Last Dose  . 0.9 %  sodium chloride infusion  500 mL Intravenous Continuous Nandigam, Kavitha V, MD       No Known Allergies Family History  Problem Relation Age of Onset  . Hyperlipidemia Mother   . Thyroid disease Mother   . Skin cancer Mother   . Diabetes Maternal Grandmother   . Healthy Father   . Colon cancer Maternal Grandfather   . Thyroid disease Sister    PE: BP 110/64 (BP Location: Left Arm, Patient Position: Sitting)   Pulse 78   Wt 153 lb (69.4 kg)   LMP 05/08/2017   SpO2 98%   BMI 26.26 kg/m  Body mass index is 26.26 kg/m.  Wt Readings from Last 3 Encounters:  05/08/17 153 lb (69.4 kg)  12/18/16 153 lb (69.4 kg)  11/19/16 151 lb 3.2 oz (68.6 kg)   Constitutional: normal weight, in NAD Eyes: PERRLA, EOMI, no exophthalmos ENT: moist mucous membranes, + thyromegaly L>R, no cervical lymphadenopathy Cardiovascular: RRR, No MRG Respiratory: CTA B Gastrointestinal: abdomen soft, NT, ND, BS+ Musculoskeletal: no deformities, strength intact in all 4 Skin: moist, warm, no rashes Neurological: no tremor with outstretched hands, DTR 3/5 in all 4  ASSESSMENT: 1. Graves disease  2. Goiter  PLAN:  1. Patient with h/o Graves ds, now controlled on low does MMI. She was off the med for 3 mo 2/2 gastritis >> now this has resolved and she restarted it last week. She mentions she did not have any hyperthyroid sxs while off MMI. We discussed that we may be able to stop the MMI again, but we have to be very careful as her Graves worsened before after the first attempt to come off the med. We decided to check labs now (will include TSIs) and, if normal >> to try to stop MMI >> repeat labs in 5 weeks. - for now, continue MMI 2.5 mg daily  - She knows to stop the Methimazole and call us if: - sore throat - fever - yellow skin - dark urine - light colored stools - No further plans for pregnancy, husband had a vasectomy. - RTC in 6 months, but  likely sooner for repeat labs  2. Goiter - mild - no neck compression sxs  Pt prefers to get labs at work (lab of [employer]) >> fax. [phone]  Needs refills. Component     Latest Ref Rng & Units 05/08/2017  TSH     0.35 - 4.50 uIU/mL 0.30 (L)  T4,Free(Direct)     0.60 - 1.60 ng/dL 0.98  Triiodothyronine,Free,Serum     2.3 - 4.2 pg/mL 3.2  TSI     <140 % baseline 305 (H)   Graves antibodies are slightly higher and the TSH is lower. I would suggest to increase the methimazole to 5mg  daily and  repeat labs in 6 weeks.  Carlus Pavlovristina Franz Svec, MD PhD [facility]

## 2017-05-08 NOTE — Patient Instructions (Signed)
Please stop at the lab.  Please continue Methimazole 2.5 mg daily.  Please come back for a follow-up appointment in 6 months.  

## 2017-05-09 LAB — T3, FREE: T3 FREE: 3.2 pg/mL (ref 2.3–4.2)

## 2017-05-09 LAB — T4, FREE: Free T4: 1.16 ng/dL (ref 0.60–1.60)

## 2017-05-09 LAB — TSH: TSH: 0.3 u[IU]/mL — ABNORMAL LOW (ref 0.35–4.50)

## 2017-05-13 LAB — THYROID STIMULATING IMMUNOGLOBULIN: TSI: 305 % baseline — ABNORMAL HIGH (ref <140)

## 2017-05-13 MED ORDER — METHIMAZOLE 5 MG PO TABS: 5.0000 mg | ORAL_TABLET | Freq: Every day | ORAL | 3 refills | Status: DC

## 2017-07-30 DIAGNOSIS — L280 Lichen simplex chronicus: Secondary | ICD-10-CM | POA: Diagnosis not present

## 2017-07-30 DIAGNOSIS — Z124 Encounter for screening for malignant neoplasm of cervix: Secondary | ICD-10-CM | POA: Diagnosis not present

## 2017-07-30 DIAGNOSIS — Z01419 Encounter for gynecological examination (general) (routine) without abnormal findings: Secondary | ICD-10-CM | POA: Diagnosis not present

## 2017-07-30 DIAGNOSIS — Z1151 Encounter for screening for human papillomavirus (HPV): Secondary | ICD-10-CM | POA: Diagnosis not present

## 2017-11-07 ENCOUNTER — Ambulatory Visit: Payer: BLUE CROSS/BLUE SHIELD | Admitting: Internal Medicine

## 2017-11-07 ENCOUNTER — Encounter: Payer: Self-pay | Admitting: Internal Medicine

## 2017-11-07 VITALS — BP 112/70 | HR 104 | Ht 64.0 in | Wt 149.0 lb

## 2017-11-07 DIAGNOSIS — E0500 Thyrotoxicosis with diffuse goiter without thyrotoxic crisis or storm: Secondary | ICD-10-CM

## 2017-11-07 NOTE — Patient Instructions (Signed)
Please stop at the lab. ° °Please continue Methimazole 5 mg daily. ° °Please come back for a follow-up appointment in 6 months. °

## 2017-11-07 NOTE — Progress Notes (Signed)
Patient ID: Kristina CroftsKelly Feeny, female   DOB: 10/12/1975, 42 y.o.   MRN: [id number]   HPI  Kristina Powell is a 42 y.o.-year-old female, returning for management of Graves ds. Last visit 6 mo ago. Work Health Provider: Claude Mangesebecca Owens, FNP, RD, LDN  She has a lot of stress at work.  Reviewed hx: Pt was dx with hyperthyroidism in 2005 >> developed this after she had her child >> [place] the time >> dx with Graves ds. (unclear if she had an uptake and scan then, but likely) >> tx with MMI from 2012-2014 >> stopped 06/2013 >> we had to restart methimazole in 2015.  Last visit, we were able to decrease her MMi dose to 2.5 mg daily >> but at last visit:  increased to 5 mg daily as TSH was slightly suppressed.  I reviewed pt's thyroid tests: Lab Results  Component Value Date   TSH 0.30 (L) 05/08/2017   TSH 0.85 10/24/2016   TSH 0.84 01/22/2016   TSH 1.52 10/23/2015   TSH 2.35 05/23/2015   TSH 0.03 (L) 12/21/2014   TSH 0.05 (L) 09/19/2014   TSH 0.12 (L) 08/03/2014   TSH 0.438 11/06/2013   TSH 0.682 08/28/2013   FREET4 1.16 05/08/2017   FREET4 0.97 01/22/2016   FREET4 0.91 05/23/2015   FREET4 2.03 (H) 12/21/2014   FREET4 1.99 (H) 09/19/2014   FREET4 1.95 (H) 08/03/2014   FREET4 1.41 08/28/2013   FREET4 1.47 07/17/2013  06/20/2016: TSH 0.77, Free T4 1.5, Free T3 2.9 03/22/2016: TSH 2.14, free T4 1.3 (0.8-1.8), free T3 2.8 (2.3-4.2) -all normal >> MMI dose decreased to 2.5 mg daily 02/16/2015: TSH 3.6, free T4 0.9, free T3 2.5 - decreased the methimazole from 10 mg 3 times a day to only 2 times a day. 11/24/2013: TSH 0.86, fT4 1.4, TSI 68 (<140%) 07/02/2012: TSH 3.18, TT4 9.4 (4.5-12) 04/14/2012: TSH 1.81  Lab Results  Component Value Date   TSI 305 (H) 05/08/2017   TSI 219 (H) 09/19/2014  11/24/2013: TSI 68 (<140%)  Pt denies: - feeling nodules in neck - hoarseness - dysphagia - choking - SOB with lying down  She sees ophthalmology (Dr. Burgess Estelleanner) - 2018. Blurry  vision still present. Wears glasses when driving. She had pterygium sx in 03/2017.  ROS: Constitutional: no weight gain/no weight loss, no fatigue, no subjective hyperthermia, no subjective hypothermia Eyes: + blurry vision, no xerophthalmia ENT: no sore throat, no nodules palpated in throat, no dysphagia, no odynophagia, no hoarseness Cardiovascular: no CP/no SOB/no palpitations/+ leg swelling Respiratory: no cough/no SOB/no wheezing Gastrointestinal: no N/no V/no D/no C/no acid reflux Musculoskeletal: no muscle aches/no joint aches Skin: no rashes, no hair loss Neurological: no tremors/no numbness/no tingling/no dizziness  I reviewed pt's medications, allergies, PMH, social hx, family hx, and changes were documented in the history of present illness. Otherwise, unchanged from my initial visit note.   Past Medical History:  Diagnosis Date  . Chicken pox   . History of leprosy 07/24/2016  . Hyperthyroidism    x2 years, post childbirth   Past Surgical History:  Procedure Laterality Date  . EYE SURGERY Right 03/2016   History   Social History  . Marital Status: Married    Spouse Name: N/A    Number of Children: 1   Occupational History  . Credit analyst   Social History Main Topics  . Smoking status: Never Smoker   . Smokeless tobacco: No  . Alcohol Use: No  .  Drug Use: No   Social History Narrative   Current Outpatient Medications on File Prior to Visit  Medication Sig Dispense Refill  . cholecalciferol (VITAMIN D) 1000 UNITS tablet Take 1,000 Units by mouth daily.    . clobetasol cream (TEMOVATE) 0.05 %   2  . esomeprazole (NEXIUM) 40 MG capsule Take 1 capsule (40 mg total) by mouth daily. 30 capsule 1  . methimazole (TAPAZOLE) 5 MG tablet Take 1 tablet (5 mg total) by mouth daily. 90 tablet 3  . ranitidine (ZANTAC) 150 MG capsule Take 1 capsule (150 mg total) by mouth 2 (two) times daily. (Patient not taking: Reported on 11/07/2017) 60 capsule 1   Current  Facility-Administered Medications on File Prior to Visit  Medication Dose Route Frequency Provider Last Rate Last Dose  . 0.9 %  sodium chloride infusion  500 mL Intravenous Continuous Nandigam, Kavitha V, MD       No Known Allergies Family History  Problem Relation Age of Onset  . Hyperlipidemia Mother   . Thyroid disease Mother   . Skin cancer Mother   . Diabetes Maternal Grandmother   . Healthy Father   . Colon cancer Maternal Grandfather   . Thyroid disease Sister    PE: BP 112/70   Pulse (!) 104   Ht 5\' 4"  (1.626 m)   Wt 149 lb (67.6 kg)   SpO2 99%   BMI 25.58 kg/m  Body mass index is 25.58 kg/m.  Wt Readings from Last 3 Encounters:  11/07/17 149 lb (67.6 kg)  05/08/17 153 lb (69.4 kg)  12/18/16 153 lb (69.4 kg)   Constitutional: normal weight, in NAD Eyes: PERRLA, EOMI, no exophthalmos ENT: moist mucous membranes, + thyromegaly L>R, no cervical lymphadenopathy Cardiovascular: tachycardia, RR, No MRG Respiratory: CTA B Gastrointestinal: abdomen soft, NT, ND, BS+ Musculoskeletal: no deformities, strength intact in all 4 Skin: moist, warm, no rashes Neurological: no tremor with outstretched hands, DTR 3/5 in all 4  ASSESSMENT: 1. Graves disease  2. Goiter  PLAN:  1. Patient with history of Graves' disease, now recurring.  At last visit, we checked her TSI antibodies and they were trending up.  At that time, she was off methimazole for 3 months due to gastritis.  She had restarted the methimazole the week before our last visit and she was on 2.5 mg daily.  TSH was 0.30, suppressed.  I advised her to increase the methimazole at that time to 5 mg. - She does have several signs and symptoms that are consistent with Graves' disease: She is tachycardic today, lost 4 pounds since last visit and also has some leg swelling - We may need to think about RAI treatment in the near future, but for now, continue methimazole 5 mg daily - We will check TFTs today and make changes  in her methimazole dose as needed - She is aware she needs to stop the methimazole if she develops: - sore throat - fever - yellow skin - dark urine - light colored stools As we will then need to check your blood counts and liver tests. - No further plans for pregnancy, husband had a vasectomy  - RTC in 6 months, but possibly sooner for labs  2. Goiter - stable - no neck compression sxs  Needs MMI refills.  Pt prefers to get labs at work (lab of [employer]) >> fax. [phone]  Component     Latest Ref Rng & Units 11/07/2017  TSH     0.450 - 4.500  uIU/mL <0.006 (L)  T4,Free(Direct)     0.82 - 1.77 ng/dL 1.61 (H)  Triiodothyronine,Free,Serum     2.0 - 4.4 pg/mL 10.0 (H)   Labs are worse >> will need to increase MMI to 10 mg bid and have her back for labs in 5 weeks.  Carlus Pavlov, MD PhD [facility]

## 2017-11-08 LAB — T3, FREE: T3, Free: 10 pg/mL — ABNORMAL HIGH (ref 2.0–4.4)

## 2017-11-08 LAB — TSH: TSH: <0.006 u[IU]/mL — ABNORMAL LOW (ref 0.450–4.500)

## 2017-11-08 LAB — T4, FREE: Free T4: 4.71 ng/dL — ABNORMAL HIGH (ref 0.82–1.77)

## 2017-11-11 ENCOUNTER — Other Ambulatory Visit: Payer: Self-pay | Admitting: Internal Medicine

## 2017-11-11 MED ORDER — METHIMAZOLE 5 MG PO TABS: 10.0000 mg | ORAL_TABLET | Freq: Two times a day (BID) | ORAL | 3 refills | Status: DC

## 2017-11-12 NOTE — Telephone Encounter (Signed)
Pt needs a script sent in to pharmacy.    methimazole (TAPAZOLE) 5 MG tablet     [facility] [id number] - [place], [place] - [address] AT [facility] OF [address]

## 2017-12-10 DIAGNOSIS — J069 Acute upper respiratory infection, unspecified: Secondary | ICD-10-CM | POA: Diagnosis not present

## 2017-12-10 DIAGNOSIS — J309 Allergic rhinitis, unspecified: Secondary | ICD-10-CM | POA: Diagnosis not present

## 2017-12-24 ENCOUNTER — Telehealth: Payer: Self-pay | Admitting: Internal Medicine

## 2017-12-24 MED ORDER — METHIMAZOLE 5 MG PO TABS: 10.0000 mg | ORAL_TABLET | Freq: Two times a day (BID) | ORAL | 0 refills | Status: DC

## 2017-12-24 NOTE — Telephone Encounter (Signed)
Called pt to inform sent Rx and fax lab orders as requested. No answer.

## 2017-12-24 NOTE — Telephone Encounter (Signed)
Patient need a new prescription for methimazole (TAPAZOLE) 5 MG tablet [[id number]][[id number]]  [phone] [place] - [address] AT [facility] OF [address] & [address] DEA #:  [id number]:  [id number]   She also need her lab orders sent to her job where she will get labs drawn. She did not know the fax number. She said it was in her chart.

## 2018-01-09 DIAGNOSIS — E0500 Thyrotoxicosis with diffuse goiter without thyrotoxic crisis or storm: Secondary | ICD-10-CM | POA: Diagnosis not present

## 2018-01-19 ENCOUNTER — Encounter: Payer: Self-pay | Admitting: Internal Medicine

## 2018-02-19 ENCOUNTER — Telehealth: Payer: Self-pay | Admitting: Internal Medicine

## 2018-02-19 NOTE — Telephone Encounter (Signed)
This has been done.

## 2018-02-19 NOTE — Telephone Encounter (Signed)
Patient is asking for her lab orders to sent to [facility] Fax # [phone] She get her blood drawn there.

## 2018-02-25 DIAGNOSIS — E0500 Thyrotoxicosis with diffuse goiter without thyrotoxic crisis or storm: Secondary | ICD-10-CM | POA: Diagnosis not present

## 2018-03-02 ENCOUNTER — Encounter: Payer: Self-pay | Admitting: Internal Medicine

## 2018-03-25 DIAGNOSIS — R10814 Left lower quadrant abdominal tenderness: Secondary | ICD-10-CM | POA: Diagnosis not present

## 2018-05-01 ENCOUNTER — Other Ambulatory Visit: Payer: Self-pay

## 2018-05-01 ENCOUNTER — Telehealth: Payer: Self-pay | Admitting: Internal Medicine

## 2018-05-01 DIAGNOSIS — E0500 Thyrotoxicosis with diffuse goiter without thyrotoxic crisis or storm: Secondary | ICD-10-CM

## 2018-05-01 NOTE — Telephone Encounter (Signed)
Faxed to [facility] fax # is [phone]

## 2018-05-01 NOTE — Telephone Encounter (Signed)
Patient states every month our office faxes Orders for Thyroid test (labs) to [employer]. Please send order via fax (she said we have the fax#)

## 2018-05-12 ENCOUNTER — Other Ambulatory Visit: Payer: Self-pay | Admitting: Internal Medicine

## 2018-05-20 DIAGNOSIS — Z0000 Encounter for general adult medical examination without abnormal findings: Secondary | ICD-10-CM | POA: Diagnosis not present

## 2018-05-20 DIAGNOSIS — E0500 Thyrotoxicosis with diffuse goiter without thyrotoxic crisis or storm: Secondary | ICD-10-CM | POA: Diagnosis not present

## 2018-05-25 DIAGNOSIS — K219 Gastro-esophageal reflux disease without esophagitis: Secondary | ICD-10-CM | POA: Diagnosis not present

## 2018-05-26 ENCOUNTER — Encounter: Payer: Self-pay | Admitting: Internal Medicine

## 2018-05-26 DIAGNOSIS — E0500 Thyrotoxicosis with diffuse goiter without thyrotoxic crisis or storm: Secondary | ICD-10-CM

## 2018-05-26 MED ORDER — METHIMAZOLE 5 MG PO TABS: ORAL_TABLET | ORAL | 0 refills | Status: DC

## 2018-05-26 NOTE — Progress Notes (Signed)
Received labs drawn on 05/20/2018: TSH 3.58, free T4 1.1.  Patient is currently on methimazole 10 mg in a.m. and 10 mg in p.m.  We will decrease the dose to 10 mg in a.m. and 5 mg in p.m. and repeat her thyroid test in 1.5 months.

## 2018-07-08 DIAGNOSIS — Z23 Encounter for immunization: Secondary | ICD-10-CM | POA: Diagnosis not present

## 2018-07-29 ENCOUNTER — Telehealth: Payer: Self-pay | Admitting: Internal Medicine

## 2018-07-29 NOTE — Telephone Encounter (Signed)
Patient has called stating they would no longer like there orders for labs sent to [facility]. Please send them to [facility] on [address]. Please advise with patient if you will mail or fax the order.

## 2018-08-03 NOTE — Telephone Encounter (Signed)
Lab orders mailed to patient.

## 2018-08-14 DIAGNOSIS — E0500 Thyrotoxicosis with diffuse goiter without thyrotoxic crisis or storm: Secondary | ICD-10-CM | POA: Diagnosis not present

## 2018-08-15 LAB — T3, FREE: T3, Free: 2.5 pg/mL (ref 2.0–4.4)

## 2018-08-15 LAB — TSH: TSH: 2.87 u[IU]/mL (ref 0.450–4.500)

## 2018-08-15 LAB — T4, FREE: Free T4: 1.18 ng/dL (ref 0.82–1.77)

## 2018-09-16 ENCOUNTER — Other Ambulatory Visit: Payer: Self-pay | Admitting: Internal Medicine

## 2018-09-30 HISTORY — PX: OTHER SURGICAL HISTORY: SHX169

## 2018-10-13 ENCOUNTER — Encounter: Payer: Self-pay | Admitting: Internal Medicine

## 2018-10-13 ENCOUNTER — Ambulatory Visit (INDEPENDENT_AMBULATORY_CARE_PROVIDER_SITE_OTHER): Payer: BLUE CROSS/BLUE SHIELD | Admitting: Internal Medicine

## 2018-10-13 VITALS — BP 110/60 | HR 88 | Ht 64.0 in | Wt 164.0 lb

## 2018-10-13 DIAGNOSIS — E049 Nontoxic goiter, unspecified: Secondary | ICD-10-CM | POA: Diagnosis not present

## 2018-10-13 DIAGNOSIS — E0500 Thyrotoxicosis with diffuse goiter without thyrotoxic crisis or storm: Secondary | ICD-10-CM | POA: Diagnosis not present

## 2018-10-13 LAB — T3, FREE: T3, Free: 3.3 pg/mL (ref 2.3–4.2)

## 2018-10-13 LAB — T4, FREE: Free T4: 0.83 ng/dL (ref 0.60–1.60)

## 2018-10-13 LAB — TSH: TSH: 5.69 u[IU]/mL — AB (ref 0.35–4.50)

## 2018-10-13 NOTE — Progress Notes (Signed)
Patient ID: Kristina Powell, female   DOB: 07/26/1976, 43 y.o.   MRN: [id number]   HPI  Kristina Powell is a 43 y.o.-year-old female, returning for management of Graves ds. Last visit 1 year ago. Work Health Provider: Claude Manges, FNP, RD, LDN  She started exercising at the gym 3x a week. She is feeling much better, more energetic, sleeps better also.  Reviewed history: Pt was dx with hyperthyroidism in 2005 >> developed this after she had her child >> [place] PCP in [place] lived at the time >> dx with Graves ds. (unclear if she had an uptake and scan then, but likely) >> tx with MMI from 2012-2014 >> stopped 06/2013 >> we had to restart methimazole in 2015.  In the past, we were able to decrease the methimazole down to 2.5 mg daily, but then we had to increase to a maximum of 10 mg twice a day due to decrease in her TSH.  Last dose change was in 04/2018 when we decreased the dose to 10 mg in a.m. and 5 mg in p.m.  Reviewed patient's TFTs: 05/20/2018: TSH 3.58, free T4 1.1.   Lab Results  Component Value Date   TSH 2.870 08/14/2018   TSH <0.006 (L) 11/07/2017   TSH 0.30 (L) 05/08/2017   TSH 0.85 10/24/2016   TSH 0.84 01/22/2016   TSH 1.52 10/23/2015   TSH 2.35 05/23/2015   TSH 0.03 (L) 12/21/2014   TSH 0.05 (L) 09/19/2014   TSH 0.12 (L) 08/03/2014   FREET4 1.18 08/14/2018   FREET4 4.71 (H) 11/07/2017   FREET4 1.16 05/08/2017   FREET4 0.97 01/22/2016   FREET4 0.91 05/23/2015   FREET4 2.03 (H) 12/21/2014   FREET4 1.99 (H) 09/19/2014   FREET4 1.95 (H) 08/03/2014   FREET4 1.41 08/28/2013   FREET4 1.47 07/17/2013  06/20/2016: TSH 0.77, Free T4 1.5, Free T3 2.9 03/22/2016: TSH 2.14, free T4 1.3 (0.8-1.8), free T3 2.8 (2.3-4.2) -all normal >> MMI dose decreased to 2.5 mg daily 02/16/2015: TSH 3.6, free T4 0.9, free T3 2.5 - decreased the methimazole from 10 mg 3 times a day to only 2 times a day. 11/24/2013: TSH 0.86, fT4 1.4, TSI 68 (<140%) 07/02/2012: TSH 3.18, TT4 9.4  (4.5-12) 04/14/2012: TSH 1.81  At last check, her TSI's were trending up: Lab Results  Component Value Date   TSI 305 (H) 05/08/2017   TSI 219 (H) 09/19/2014  11/24/2013: TSI 68 (<140%)  Pt denies: - feeling nodules in neck - hoarseness - dysphagia - choking - SOB with lying down  She sees ophthalmology (Dr. Burgess Estelle) >> last visit 2019.  Resolved blurry vision. Feels left eye larger than the right. Wears glasses when driving. She had pterygium sx in 03/2017.  Mother had hemithyroidectomy. Sister with thyroid dysfunction. No FH of ThyCA.  ROS: Constitutional: no weight gain/no weight loss, no fatigue, no subjective hyperthermia, no subjective hypothermia Eyes: no blurry vision, no xerophthalmia ENT: no sore throat, + see HPI Cardiovascular: no CP/no SOB/no palpitations/no leg swelling Respiratory: no cough/no SOB/no wheezing Gastrointestinal: no N/no V/no D/no C/no acid reflux Musculoskeletal: no muscle aches/no joint aches Skin: no rashes, no hair loss Neurological: no tremors/no numbness/no tingling/no dizziness  I reviewed pt's medications, allergies, PMH, social hx, family hx, and changes were documented in the history of present illness. Otherwise, unchanged from my initial visit note.   Past Medical History:  Diagnosis Date  . Chicken pox   . History of leprosy 07/24/2016  . Hyperthyroidism  x2 years, post childbirth   Past Surgical History:  Procedure Laterality Date  . EYE SURGERY Right 03/2016   History   Social History  . Marital Status: Married    Spouse Name: N/A    Number of Children: 1   Occupational History  . Credit analyst   Social History Main Topics  . Smoking status: Never Smoker   . Smokeless tobacco: No  . Alcohol Use: No  . Drug Use: No   Social History Narrative   Current Outpatient Medications on File Prior to Visit  Medication Sig Dispense Refill  . cholecalciferol (VITAMIN D) 1000 UNITS tablet Take 1,000 Units by mouth  daily.    . clobetasol cream (TEMOVATE) 0.05 %   2  . esomeprazole (NEXIUM) 40 MG capsule Take 1 capsule (40 mg total) by mouth daily. 30 capsule 1  . methimazole (TAPAZOLE) 5 MG tablet TAKE 2 TABLETS(10 MG) BY MOUTH TWICE DAILY 360 tablet 0  . ranitidine (ZANTAC) 150 MG capsule Take 1 capsule (150 mg total) by mouth 2 (two) times daily. (Patient not taking: Reported on 11/07/2017) 60 capsule 1   Current Facility-Administered Medications on File Prior to Visit  Medication Dose Route Frequency Provider Last Rate Last Dose  . 0.9 %  sodium chloride infusion  500 mL Intravenous Continuous Nandigam, Kavitha V, MD       No Known Allergies Family History  Problem Relation Age of Onset  . Hyperlipidemia Mother   . Thyroid disease Mother   . Skin cancer Mother   . Diabetes Maternal Grandmother   . Healthy Father   . Colon cancer Maternal Grandfather   . Thyroid disease Sister    PE: BP 110/60   Pulse 88   Ht 5\' 4"  (1.626 m)   Wt 164 lb (74.4 kg)   SpO2 99%   BMI 28.15 kg/m  Body mass index is 28.15 kg/m.  Wt Readings from Last 3 Encounters:  10/13/18 164 lb (74.4 kg)  11/07/17 149 lb (67.6 kg)  05/08/17 153 lb (69.4 kg)   Constitutional: overweight, in NAD Eyes: PERRLA, EOMI, no exophthalmos ENT: moist mucous membranes, + L>R thyromegaly, no cervical lymphadenopathy Cardiovascular: RRR, No MRG Respiratory: CTA B Gastrointestinal: abdomen soft, NT, ND, BS+ Musculoskeletal: no deformities, strength intact in all 4 Skin: moist, warm, no rashes Neurological: no tremor with outstretched hands, DTR +3/4 in all 4   ASSESSMENT: 1. Graves disease  2. Goiter  PLAN:  1. Patient with history of recurring Graves' disease.  TSI's were trending up and at last visit her TFTs were thyrotoxic so we increased her methimazole to 10 mg twice a day at last visit.  Since then, we were able to reduce the dose to 10 mg in a.m. and 5 mg in the afternoon in 04/2018.  Subsequent TFTs were normal in  07/2018. - At last visit she had tachycardia, a 4 pound weight loss and some leg swelling.  These have resolved.  She did gain 15 pounds since last visit, possibly with the holidays.  However, she started to exercise and feels much better. - We again discussed about modalities for treatment of her Graves' disease including continuing methimazole but probably stay on the higher dose, 5 mg daily for a longer time, RAI treatment, or, last resort, thyroidectomy.  We did discuss that the latter 2 modalities would also get rid of her goiter - For now, she opted to continue methimazole, but we decided that if we cannot control her disease with  5 mg of methimazole, we would go ahead and do RAI treatment.  We discussed about the fact that she will need to be on levothyroxine for the rest of her life, most likely, afterwards.  Euthyroidism is possible, but it is the exception after RAI treatment. - She is aware she needs to stop methimazole if she develops - sore throat - fever - yellow skin - dark urine - light colored stools As we will then need to check your blood counts and liver tests. - No further plans for pregnancy, husband had a vasectomy.  She has a 10 year old child. - We will check TFTs today and add TSI's.  I explained what an elevated TSI means. - I will see her back in 6 months  2. Goiter - stable, again felt on palpation today, especially the left lobe - No neck compression symptoms  Pt prefers to get labs at work (lab of [employer]) >> fax. [phone]  Component     Latest Ref Rng & Units 10/13/2018  TSH     0.35 - 4.50 uIU/mL 5.69 (H)  T4,Free(Direct)     0.60 - 1.60 ng/dL 3.84  Triiodothyronine,Free,Serum     2.3 - 4.2 pg/mL 3.3  TSI     <140 % baseline 264 (H)   TSI antibodies are still high, but slightly improved. TSH is above normal.  Will decrease methimazole to 5 mg twice a day and repeat her labs in 1.5 months.  Carlus Pavlov, MD PhD [facility] Endocr[facility]

## 2018-10-13 NOTE — Patient Instructions (Signed)
Please stop at the lab.  Please continue Methimazole 10 mg in am and 5 mg in pm.  Please come back for a follow-up appointment in 6 months.

## 2018-10-15 ENCOUNTER — Encounter: Payer: Self-pay | Admitting: Internal Medicine

## 2018-10-15 DIAGNOSIS — L280 Lichen simplex chronicus: Secondary | ICD-10-CM | POA: Diagnosis not present

## 2018-10-15 LAB — THYROID STIMULATING IMMUNOGLOBULIN: TSI: 264 % baseline — ABNORMAL HIGH (ref <140)

## 2018-10-15 MED ORDER — METHIMAZOLE 5 MG PO TABS: 5.0000 mg | ORAL_TABLET | Freq: Two times a day (BID) | ORAL | 1 refills | Status: DC

## 2018-12-22 DIAGNOSIS — R6889 Other general symptoms and signs: Secondary | ICD-10-CM | POA: Diagnosis not present

## 2018-12-22 DIAGNOSIS — J0110 Acute frontal sinusitis, unspecified: Secondary | ICD-10-CM | POA: Diagnosis not present

## 2018-12-24 ENCOUNTER — Other Ambulatory Visit: Payer: Self-pay | Admitting: Internal Medicine

## 2019-02-25 ENCOUNTER — Telehealth: Payer: Self-pay

## 2019-02-25 NOTE — Telephone Encounter (Signed)
Used to see Selena Batten- would Dr. Carmelia Roller take?

## 2019-02-25 NOTE — Telephone Encounter (Signed)
Copied from CRM [id number]. Topic: Appointment Scheduling - Scheduling Inquiry for Clinic >> Feb 24, 2019  5:50 PM Jay Schlichter wrote: Reason for CRM: pt was last seen 06/2016. She would like to make an appt with MD for transfer of care  Cb is [phone].

## 2019-02-25 NOTE — Telephone Encounter (Signed)
Scheduled NP appt. For 03/01/2019

## 2019-02-25 NOTE — Telephone Encounter (Signed)
OK w me.  

## 2019-03-01 ENCOUNTER — Encounter: Payer: Self-pay | Admitting: Family Medicine

## 2019-03-01 ENCOUNTER — Other Ambulatory Visit: Payer: Self-pay

## 2019-03-01 ENCOUNTER — Ambulatory Visit (INDEPENDENT_AMBULATORY_CARE_PROVIDER_SITE_OTHER): Payer: BLUE CROSS/BLUE SHIELD | Admitting: Family Medicine

## 2019-03-01 DIAGNOSIS — R1013 Epigastric pain: Secondary | ICD-10-CM

## 2019-03-01 MED ORDER — FAMOTIDINE 40 MG PO TABS: 40.0000 mg | ORAL_TABLET | Freq: Every day | ORAL | 1 refills | Status: DC

## 2019-03-01 MED ORDER — PANTOPRAZOLE SODIUM 40 MG PO TBEC: DELAYED_RELEASE_TABLET | ORAL | 3 refills | Status: DC

## 2019-03-01 NOTE — Progress Notes (Signed)
Chief Complaint  Patient presents with  . New Patient (Initial Visit)       New Patient Visit SUBJECTIVE: HPI: Jwana Hofstetter is an 43 y.o.female who is being seen for establishing care.  Due to COVID-19 pandemic, we are interacting via web portal for an electronic face-to-face visit. I verified patient's ID using 2 identifiers. Patient agreed to proceed with visit via this method. Patient is at home, I am at office. Patient and I are present for visit.   +hx of gastritis over past 1.5 weeks. Currently taking Protonix that has been 40% helpful. Eating makes epigastric discomfort better. She is having some nausea as well. +nighttime awakenings. No bowel changes, vomiting, bleeding, fevers, injury. GI saw her around 2 years ago.    No Known Allergies  Past Medical History:  Diagnosis Date  . Chicken pox   . History of leprosy 07/24/2016  . Hyperthyroidism    x2 years, post childbirth   Past Surgical History:  Procedure Laterality Date  . EYE SURGERY Right 03/2016   Family History  Problem Relation Age of Onset  . Hyperlipidemia Mother   . Thyroid disease Mother   . Skin cancer Mother   . Diabetes Maternal Grandmother   . Healthy Father   . Colon cancer Maternal Grandfather   . Thyroid disease Sister    No Known Allergies  Current Outpatient Medications:  .  cholecalciferol (VITAMIN D) 1000 UNITS tablet, Take 1,000 Units by mouth daily., Disp: , Rfl:  .  clobetasol cream (TEMOVATE) 0.05 %, , Disp: , Rfl: 2 .  famotidine (PEPCID) 40 MG tablet, Take 1 tablet (40 mg total) by mouth daily., Disp: 30 tablet, Rfl: 1 .  methimazole (TAPAZOLE) 5 MG tablet, TAKE 2 TABLETS(10 MG) BY MOUTH TWICE DAILY, Disp: 360 tablet, Rfl: 1 .  pantoprazole (PROTONIX) 40 MG tablet, Take 1 tab twice daily for 2 weeks then 1 tab daily., Disp: 30 tablet, Rfl: 3  Current Facility-Administered Medications:  .  0.9 %  sodium chloride infusion, 500 mL, Intravenous, Continuous, Nandigam, Kavitha V, MD  No  LMP recorded.  ROS Const: Denies fevers  GI: As noted in HPI   OBJECTIVE: No conversational dyspnea Age appropriate judgment and insight Nml affect and mood  ASSESSMENT/PLAN: Epigastric abdominal pain - Plan: pantoprazole (PROTONIX) 40 MG tablet, famotidine (PEPCID) 40 MG tablet  Patient instructed to contact GI team, let us know if she needs a referral. BID protonix for 2 weeks then daily. Add H2 blocker.  Patient should return for CPE at earliest convenience. The patient voiced understanding and agreement to the plan.   Jilda Roche Steptoe, DO 03/01/19  11:17 AM

## 2019-03-08 ENCOUNTER — Telehealth: Payer: Self-pay | Admitting: Gastroenterology

## 2019-03-08 ENCOUNTER — Other Ambulatory Visit: Payer: Self-pay

## 2019-03-08 MED ORDER — SUCRALFATE 1 G PO TABS: 1.0000 g | ORAL_TABLET | Freq: Three times a day (TID) | ORAL | 0 refills | Status: DC

## 2019-03-08 NOTE — Telephone Encounter (Signed)
Patient has talked with her company doctor and more recently her PCP. She is taking Pantoprazole BID. Famotidine at bedtime. She feels very stressed. She is working from home.  Describes her pain as LUQ and "like a hunger pain"  That feels better if she will eat. Her pain has made it difficult to sleep. She did sleep last night , which she states is an improvement. She is eating carefully and has even eliminated coffee.  She is asking for Carafate also. Does plan to keep the appointment as scheduled unless a sooner one opens up. Please advise.

## 2019-03-08 NOTE — Telephone Encounter (Signed)
Patient instructed.

## 2019-03-08 NOTE — Telephone Encounter (Signed)
PCP put her on Pantoprazole and Famotidine on 03/01/19. Previously on Omeprazole and Ranitidine.  No sooner appointments. Called patient. No answer. Left a voicemail to call us back to discuss.

## 2019-03-08 NOTE — Telephone Encounter (Signed)
Okay, please send prescription for Carafate tablet 1 g before meals and at bedtime as needed.Follow-up telemedicine visit

## 2019-03-23 ENCOUNTER — Encounter: Payer: Self-pay | Admitting: *Deleted

## 2019-03-24 ENCOUNTER — Ambulatory Visit (INDEPENDENT_AMBULATORY_CARE_PROVIDER_SITE_OTHER): Payer: BC Managed Care – PPO | Admitting: Gastroenterology

## 2019-03-24 ENCOUNTER — Encounter: Payer: Self-pay | Admitting: Gastroenterology

## 2019-03-24 VITALS — Ht 62.0 in | Wt 160.0 lb

## 2019-03-24 DIAGNOSIS — K219 Gastro-esophageal reflux disease without esophagitis: Secondary | ICD-10-CM | POA: Diagnosis not present

## 2019-03-24 DIAGNOSIS — R1013 Epigastric pain: Secondary | ICD-10-CM

## 2019-03-24 NOTE — Progress Notes (Signed)
Kristina Powell    [id number]    10/30/1975  Primary Care Physician:Wendling, Crosby Oyster, DO  Referring Physician: Shelda Pal, [place],   [id number]  This service was provided via audio and video telemedicine (Doximity) due to [place] 19 pandemic.  Patient location: Home Provider location: Office Used 2 patient identifiers to confirm the correct person. Explained the limitations in evaluation and management via telemedicine. Patient is aware of potential medical charges for this visit.  Patient consented to this virtual visit.  The persons participating in this telemedicine service were myself and the patient    Chief complaint: Epigastric abdominal pain HPI:  43 year old female with complaints of epigastric abdominal pain.  She started having recurrent symptoms during this pandemic, was prescribed Protonix once a day by PMD, subsequently increased to twice a day with no significant improvement.  She was given a prescription for Carafate when she called her office and is feeling somewhat better.  Her appetite is has improved but continues to have intermittent epigastric abdominal pain.  No change in bowel habits, melena or blood per rectum.  Denies excessive use of NSAIDs.   EGD December 18, 2016 showed mild gastritis, biopsies showed normal gastric mucosa with no abnormality and negative for H. Pylori.  Outpatient Encounter Medications as of 03/24/2019  Medication Sig  . cholecalciferol (VITAMIN D) 1000 UNITS tablet Take 1,000 Units by mouth daily. Three times a day  . clobetasol cream (TEMOVATE) 0.05 %   . famotidine (PEPCID) 40 MG tablet Take 1 tablet (40 mg total) by mouth daily.  . methimazole (TAPAZOLE) 5 MG tablet TAKE 2 TABLETS(10 MG) BY MOUTH TWICE DAILY  . pantoprazole (PROTONIX) 40 MG tablet Take 1 tab twice daily for 2 weeks then 1 tab daily.  . sucralfate (CARAFATE) 1 g tablet Take 1 tablet (1 g total) by mouth 4  (four) times daily -  with meals and at bedtime for 30 days.   Facility-Administered Encounter Medications as of 03/24/2019  Medication  . 0.9 %  sodium chloride infusion    Allergies as of 03/24/2019  . (No Known Allergies)    Past Medical History:  Diagnosis Date  . Chicken pox   . History of leprosy 07/24/2016  . Hyperthyroidism    x2 years, post childbirth    Past Surgical History:  Procedure Laterality Date  . bone graft dental    . EYE SURGERY Right 03/2016    Family History  Problem Relation Age of Onset  . Hyperlipidemia Mother   . Thyroid disease Mother   . Skin cancer Mother   . Diabetes Maternal Grandmother   . Healthy Father   . Thyroid disease Sister     Social History   Socioeconomic History  . Marital status: Married    Spouse name: Not on file  . Number of children: 1  . Years of education: 50  . Highest education level: Not on file  Occupational History  . Occupation: [employer]  Social Needs  . Financial resource strain: Not on file  . Food insecurity    Worry: Not on file    Inability: Not on file  . Transportation needs    Medical: Not on file    Non-medical: Not on file  Tobacco Use  . Smoking status: Never Smoker  . Smokeless tobacco: Never Used  Substance and Sexual Activity  .  Alcohol use: No  . Drug use: No  . Sexual activity: Yes  Lifestyle  . Physical activity    Days per week: Not on file    Minutes per session: Not on file  . Stress: Not on file  Relationships  . Social Musicianconnections    Talks on phone: Not on file    Gets together: Not on file    Attends religious service: Not on file    Active member of club or organization: Not on file    Attends meetings of clubs or organizations: Not on file    Relationship status: Not on file  . Intimate partner violence    Fear of current or ex partner: Not on file    Emotionally abused: Not on file    Physically abused: Not on file    Forced sexual activity: Not on file  Other  Topics Concern  . Not on file  Social History Narrative   Lives at home w/ her husband and son   Right-handed   Caffeine: 2-3 cups of coffee in the morning      Review of systems: Review of Systems as per HPI All other systems reviewed and are negative.   Physical Exam: Vitals were not taken and physical exam was not performed during this virtual visit.  Data Reviewed:  Reviewed labs, radiology imaging, old records and pertinent past GI work up   Assessment and Plan/Recommendations:  43 year old female history of Graves' disease and GERD with complaints of epigastric abdominal pain  Differential includes gallbladder disease, gastritis, dyspepsia, irritable bowel syndrome or functional  EGD February 2018 unrevealing for any significant pathology  Continue Protonix Antireflux measures Carafate 1 g before meals and at bedtime  We will obtain abdominal ultrasound to exclude gallbladder disease  Follow-up in 3 months    K. Scherry RanVeena Nandigam , MD   CC: Sharlene DoryWendling, Nicholas Paul*

## 2019-03-24 NOTE — Patient Instructions (Addendum)
Schedule abdominal ultrasound to exclude gallbladder disease  You have been scheduled for an abdominal ultrasound at [facility] (1st floor of hospital) on 03/29/2019 at [place]. Please arrive 15 minutes prior to your appointment for registration. Make certain not to have anything to eat or drink 6 hours prior to your appointment. Should you need to reschedule your appointment, please contact radiology at [phone]. This test typically takes about 30 minutes to perform.  Continue Protonix  Continue Carafate before meals and at bedtime as needed  Follow-up virtual visit in 3 months  I appreciate the  opportunity to care for you  Thank You   Harl Bowie , MD

## 2019-03-29 ENCOUNTER — Ambulatory Visit (HOSPITAL_COMMUNITY): Payer: BC Managed Care – PPO

## 2019-03-29 ENCOUNTER — Ambulatory Visit (HOSPITAL_COMMUNITY)
Admission: RE | Admit: 2019-03-29 | Discharge: 2019-03-29 | Disposition: A | Discharge status: Home / Self Care | Payer: BC Managed Care – PPO | Source: Ambulatory Visit | Attending: Gastroenterology | Admitting: Gastroenterology

## 2019-03-29 ENCOUNTER — Telehealth: Payer: Self-pay | Admitting: Gastroenterology

## 2019-03-29 ENCOUNTER — Other Ambulatory Visit: Payer: Self-pay | Admitting: *Deleted

## 2019-03-29 ENCOUNTER — Other Ambulatory Visit: Payer: Self-pay

## 2019-03-29 DIAGNOSIS — R1013 Epigastric pain: Secondary | ICD-10-CM

## 2019-03-29 DIAGNOSIS — K219 Gastro-esophageal reflux disease without esophagitis: Secondary | ICD-10-CM | POA: Insufficient documentation

## 2019-03-29 DIAGNOSIS — R109 Unspecified abdominal pain: Secondary | ICD-10-CM | POA: Diagnosis not present

## 2019-03-29 MED ORDER — SUCRALFATE 1 G PO TABS: 1.0000 g | ORAL_TABLET | Freq: Three times a day (TID) | ORAL | 5 refills | Status: DC

## 2019-03-29 NOTE — Telephone Encounter (Signed)
Spoke to the patient who reports continued reflux symptoms despite taking medications as prescribed and severe diet changes. The patient said she is wondering if she has h.pylori because her symptoms are not getting better but she does not think she can tolerate being off of all PPI's for 14 days. The patient is convinced she may have h.pylori because she traveled to [place] last year and her husband was dx with it.  The patient is requesting a refill of the Carafate as this was only a 30 day supply, she displayed anxiety concerning this prescription ending in approximately a week.  Please advise.

## 2019-03-29 NOTE — Telephone Encounter (Signed)
Pt requested to get tested for H. pylori because her husband had it in [place],

## 2019-03-29 NOTE — Telephone Encounter (Signed)
Please advise 

## 2019-03-29 NOTE — Telephone Encounter (Signed)
We had tested her in the past and she was negative.  If she is still concerned please check H. pylori stool antigen off PPI for 2 weeks

## 2019-03-29 NOTE — Telephone Encounter (Signed)
Ok please send the refill for carafate, 90 days supply with 3 refills. If do H.pylori test while on PPI can give a false negative. Given the cost and potential side effects with antibiotics, do not recommend empiric therapy for H.pylori.

## 2019-03-29 NOTE — Telephone Encounter (Signed)
Prescription sent to pharmacy. Patient notified

## 2019-03-30 ENCOUNTER — Encounter: Payer: Self-pay | Admitting: *Deleted

## 2019-04-07 ENCOUNTER — Other Ambulatory Visit (HOSPITAL_COMMUNITY): Payer: BC Managed Care – PPO

## 2019-04-07 ENCOUNTER — Telehealth: Payer: Self-pay | Admitting: Gastroenterology

## 2019-04-07 NOTE — Telephone Encounter (Signed)
Pt stated that she is experiencing abd p.  She would like a call back.

## 2019-04-07 NOTE — Telephone Encounter (Signed)
Ok to schedule EGD, next available. Thanks

## 2019-04-07 NOTE — Telephone Encounter (Signed)
Scheduled for pre visit on 04/09/19. EGD is on 04/15/19. Patient agrees to this plan.

## 2019-04-07 NOTE — Telephone Encounter (Signed)
Patient continues to complain of pain. She states the u/s was uncomfortable. She could not bear the pressure of the probe when the tech pressed down. She started probiotics last week for her diarrhea. It got better but not gone. She has soft unformed stool and has gone 3 times today. She is avoiding dairy, caffeone and artificial sweeteners. Walking during the day for physical and mental health. Confirmed her medications.  Patient wants an EGD to "have a look" to see what is wrong. The pain is waking her up during the night. Patient is unhappy.

## 2019-04-09 ENCOUNTER — Ambulatory Visit (AMBULATORY_SURGERY_CENTER): Payer: Self-pay | Admitting: *Deleted

## 2019-04-09 ENCOUNTER — Other Ambulatory Visit: Payer: Self-pay

## 2019-04-09 VITALS — Ht 64.0 in | Wt 158.0 lb

## 2019-04-09 DIAGNOSIS — R1013 Epigastric pain: Secondary | ICD-10-CM

## 2019-04-09 NOTE — Progress Notes (Signed)
No egg or soy allergy known to patient  No issues with past sedation with any surgeries  or procedures, no intubation problems  No diet pills per patient No home 02 use per patient  No blood thinners per patient  Pt denies issues with constipation  No A fib or A flutter  EMMI video sent to pt's e mail   Pt wears a sleeper tooth from a dental bone graft 09-2018- it is removable- she will remove this for the EGD she said   Pt verified name, DOB, address and insurance during PV today. Pt mailed instruction packet to included paper to complete and mail back to [facility] with addressed and stamped envelope, Emmi video, copy of consent form to read and not return, and instructions. PV completed over the phone. Pt encouraged to call with questions or issues   Pt is aware that care partner will wait in the car during proceudre; if they feel like they will be too hot to wait in the car; they may wait in the lobby.  We want them to wear a mask (we do not have any that we can provide them), practice social distancing, and we will check their temperatures when they get here.  I did remind patient that their care partner needs to stay in the parking lot the entire time. Pt will wear mask into building.

## 2019-04-14 ENCOUNTER — Telehealth: Payer: Self-pay

## 2019-04-14 ENCOUNTER — Telehealth: Payer: Self-pay | Admitting: Gastroenterology

## 2019-04-14 NOTE — Telephone Encounter (Signed)
Called patient and left voicemail to go give [place] a call back. Please tell patient that I talked with the medical assistant and she said yes we can cancel the appointment for Friday and after your procedure patient will talk to dr about her concerns and then they can see about setting up an appointment but for now we don't need to make another appointment just yet

## 2019-04-14 NOTE — Telephone Encounter (Signed)
Patient answered "NO" to all covid-19 questions °

## 2019-04-14 NOTE — Telephone Encounter (Signed)

## 2019-04-14 NOTE — Telephone Encounter (Signed)
Patient returned phone call. I informed her that Fridays appointment has been canceled and that the dr will speak to her after tomorrow's procedure.

## 2019-04-14 NOTE — Telephone Encounter (Signed)
Covid-19 screening questions   Do you now or have you had a fever in the last 14 days?  Do you have any respiratory symptoms of shortness of breath or cough now or in the last 14 days?  Do you have any family members or close contacts with diagnosed or suspected Covid-19 in the past 14 days?  Have you been tested for Covid-19 and found to be positive?  Were you referred for the appointment?  Patient said she will call us back

## 2019-04-15 ENCOUNTER — Ambulatory Visit (INDEPENDENT_AMBULATORY_CARE_PROVIDER_SITE_OTHER): Payer: BC Managed Care – PPO | Admitting: Internal Medicine

## 2019-04-15 ENCOUNTER — Ambulatory Visit (AMBULATORY_SURGERY_CENTER): Payer: BC Managed Care – PPO | Admitting: Gastroenterology

## 2019-04-15 ENCOUNTER — Encounter: Payer: Self-pay | Admitting: Internal Medicine

## 2019-04-15 ENCOUNTER — Other Ambulatory Visit: Payer: Self-pay

## 2019-04-15 ENCOUNTER — Encounter: Payer: Self-pay | Admitting: Gastroenterology

## 2019-04-15 VITALS — BP 113/59 | HR 62 | Temp 98.2°F | Resp 19 | Ht 64.0 in | Wt 158.0 lb

## 2019-04-15 DIAGNOSIS — K3189 Other diseases of stomach and duodenum: Secondary | ICD-10-CM | POA: Diagnosis not present

## 2019-04-15 DIAGNOSIS — R1013 Epigastric pain: Secondary | ICD-10-CM

## 2019-04-15 DIAGNOSIS — E049 Nontoxic goiter, unspecified: Secondary | ICD-10-CM

## 2019-04-15 DIAGNOSIS — E0500 Thyrotoxicosis with diffuse goiter without thyrotoxic crisis or storm: Secondary | ICD-10-CM

## 2019-04-15 DIAGNOSIS — R109 Unspecified abdominal pain: Secondary | ICD-10-CM | POA: Diagnosis not present

## 2019-04-15 DIAGNOSIS — K210 Gastro-esophageal reflux disease with esophagitis: Secondary | ICD-10-CM | POA: Diagnosis not present

## 2019-04-15 MED ORDER — SODIUM CHLORIDE 0.9 % IV SOLN: 500.0000 mL | Freq: Once | INTRAVENOUS | Status: DC

## 2019-04-15 MED ORDER — PANTOPRAZOLE SODIUM 40 MG PO TBEC: 40.0000 mg | DELAYED_RELEASE_TABLET | Freq: Every day | ORAL | 3 refills | Status: DC

## 2019-04-15 NOTE — Progress Notes (Signed)
Temperature taken by Courtney Washington, CMA, VS taken by Judy Branson, CMA 

## 2019-04-15 NOTE — Patient Instructions (Signed)
Please come back to the lab at your earliest convenience.  Continue methimazole 5 mg twice a day.  Please come back for a follow-up appointment in 6 months

## 2019-04-15 NOTE — Op Note (Signed)
[facility] Patient Name: Kristina CroftsKelly Bourdon Procedure Date: 04/15/2019 9:20 AM MRN: [id number] Endoscopist: Napoleon FormKavitha V. Codey Burling , MD Age: 43 Referring MD:  Date of Birth: 01/15/1976 Gender: Female Account #: [id number] Procedure:                Upper GI endoscopy Indications:              Epigastric abdominal pain Medicines:                Monitored Anesthesia Care Procedure:                Pre-Anesthesia Assessment:                           - Prior to the procedure, a History and Physical                            was performed, and patient medications and                            allergies were reviewed. The patient's tolerance of                            previous anesthesia was also reviewed. The risks                            and benefits of the procedure and the sedation                            options and risks were discussed with the patient.                            All questions were answered, and informed consent                            was obtained. Prior Anticoagulants: The patient has                            taken no previous anticoagulant or antiplatelet                            agents. ASA Grade Assessment: II - A patient with                            mild systemic disease. After reviewing the risks                            and benefits, the patient was deemed in                            satisfactory condition to undergo the procedure.                           After obtaining informed consent, the endoscope was  passed under direct vision. Throughout the                            procedure, the patient's blood pressure, pulse, and                            oxygen saturations were monitored continuously. The                            Endoscope was introduced through the mouth, and                            advanced to the second part of duodenum. The upper                            GI endoscopy was accomplished  without difficulty.                            The patient tolerated the procedure well. Scope In: Scope Out: Findings:                 LA Grade B (one or more mucosal breaks greater than                            5 mm, not extending between the tops of two mucosal                            folds) esophagitis was found 38 to 40 cm from the                            incisors.                           Patchy minimal inflammation characterized by                            congestion (edema) and erythema was found in the                            entire examined stomach. Biopsies were taken with a                            cold forceps for Helicobacter pylori testing.                           The examined duodenum was normal. Complications:            No immediate complications. Estimated Blood Loss:     Estimated blood loss was minimal. Impression:               - LA Grade B reflux esophagitis.                           - Gastritis. Biopsied.                           -  Normal examined duodenum. Recommendation:           - Patient has a contact number available for                            emergencies. The signs and symptoms of potential                            delayed complications were discussed with the                            patient. Return to normal activities tomorrow.                            Written discharge instructions were provided to the                            patient.                           - Resume previous diet.                           - Continue present medications.                           - Await pathology results.                           - Follow an antireflux regimen.                           - Use Protonix (pantoprazole) 40 mg PO daily.                           - IB Gard 1 capsule TID with meals                           - Return to GI office for management of IBS                            symptoms at the next available  appointment. Mauri Pole, MD 04/15/2019 9:39:31 AM This report has been signed electronically.

## 2019-04-15 NOTE — Progress Notes (Signed)
Called to room to assist during endoscopic procedure.  Patient ID and intended procedure confirmed with present staff. Received instructions for my participation in the procedure from the performing physician.  

## 2019-04-15 NOTE — Progress Notes (Signed)
A/ox3, pleased with MAC, report to RN 

## 2019-04-15 NOTE — Patient Instructions (Signed)
Await pathology results.  Use Protonix 40mg  by mouth daily.  IB Gard 1 capsule 3 times a day with meals.  Return to GI office at next available appointment.  YOU HAD AN ENDOSCOPIC PROCEDURE TODAY AT [facility]:   Refer to the procedure report that was given to you for any specific questions about what was found during the examination.  If the procedure report does not answer your questions, please call your gastroenterologist to clarify.  If you requested that your care partner not be given the details of your procedure findings, then the procedure report has been included in a sealed envelope for you to review at your convenience later.  YOU SHOULD EXPECT: Some feelings of bloating in the abdomen. Passage of more gas than usual.  Walking can help get rid of the air that was put into your GI tract during the procedure and reduce the bloating. If you had a lower endoscopy (such as a colonoscopy or flexible sigmoidoscopy) you may notice spotting of blood in your stool or on the toilet paper. If you underwent a bowel prep for your procedure, you may not have a normal bowel movement for a few days.  Please Note:  You might notice some irritation and congestion in your nose or some drainage.  This is from the oxygen used during your procedure.  There is no need for concern and it should clear up in a day or so.  SYMPTOMS TO REPORT IMMEDIATELY:    Following upper endoscopy (EGD)  Vomiting of blood or coffee ground material  New chest pain or pain under the shoulder blades  Painful or persistently difficult swallowing  New shortness of breath  Fever of 100F or higher  Black, tarry-looking stools  For urgent or emergent issues, a gastroenterologist can be reached at any hour by calling [phone].   DIET:  We do recommend a small meal at first, but then you may proceed to your regular diet.  Drink plenty of fluids but you should avoid alcoholic beverages for 24  hours.  ACTIVITY:  You should plan to take it easy for the rest of today and you should NOT DRIVE or use heavy machinery until tomorrow (because of the sedation medicines used during the test).    FOLLOW UP: Our staff will call the number listed on your records 48-72 hours following your procedure to check on you and address any questions or concerns that you may have regarding the information given to you following your procedure. If we do not reach you, we will leave a message.  We will attempt to reach you two times.  During this call, we will ask if you have developed any symptoms of COVID 19. If you develop any symptoms (ie: fever, flu-like symptoms, shortness of breath, cough etc.) before then, please call [phone].  If you test positive for Covid 19 in the 2 weeks post procedure, please call and report this information to [place].    If any biopsies were taken you will be contacted by phone or by letter within the next 1-3 weeks.  Please call us at [phone] if you have not heard about the biopsies in 3 weeks.    SIGNATURES/CONFIDENTIALITY: You and/or your care partner have signed paperwork which will be entered into your electronic medical record.  These signatures attest to the fact that that the information above on your After Visit Summary has been reviewed and is understood.  Full responsibility of the confidentiality of  this discharge information lies with you and/or your care-partner.

## 2019-04-15 NOTE — Progress Notes (Signed)
Patient ID: Kristina Powell, female   DOB: 12/05/1975, 43 y.o.   MRN: [id number]  Patient location: Home My location: Office  Referring Provider: Sharlene DoryWendling, Nicholas Paul, DO  I connected with the patient on 04/15/19 at  1:04 PM EDT by a video enabled telemedicine application and verified that I am speaking with the correct person.  We had problems with the connection and ended up converting into a telephone encounter, however, approximately 75% of the visit was done through the virtual platform.   I discussed the limitations of evaluation and management by telemedicine and the availability of in person appointments. The patient expressed understanding and agreed to proceed.   Details of the encounter are shown below.  HPI  Kristina Powell is a 43 y.o.-year-old female, presenting for management of Graves ds. Last visit 6 months ago. Work Health Provider: Claude Mangesebecca Owens, FNP, RD, LDN  Before last visit, she started exercising at the gym 3 times a week.  She was feeling much better, more energetic and sleeping better. However, now she c/o 3 mo of AP, gas >> had EGD today.  She is on an H2 blocker.  She is on a strict diet mostly due to her stomach problems >> lost 6 lbs.  Reviewed history: Pt was dx with hyperthyroidism in 2005 >> developed this after she had her child >> saw PCP in [place], where she lived at the time >> dx with Graves ds. (unclear if she had an uptake and scan then, but likely) >> tx with MMI from 2012-2014 >> stopped 06/2013 >> we had to restart methimazole in 2015.  In the past, we were able to decrease her methimazole dose to 2.5 mg daily but then we had to increase it up to 10 mg twice a day.  Currently on 5 mg twice a day, decreased 09/2018.  She did not return for repeat labs afterwards.  Reviewed patient's TFTs: Lab Results  Component Value Date   TSH 5.69 (H) 10/13/2018   TSH 2.870 08/14/2018   TSH <0.006 (L) 11/07/2017   TSH 0.30 (L) 05/08/2017   TSH 0.85 10/24/2016   TSH 0.84  01/22/2016   TSH 1.52 10/23/2015   TSH 2.35 05/23/2015   TSH 0.03 (L) 12/21/2014   TSH 0.05 (L) 09/19/2014   FREET4 0.83 10/13/2018   FREET4 1.18 08/14/2018   FREET4 4.71 (H) 11/07/2017   FREET4 1.16 05/08/2017   FREET4 0.97 01/22/2016   FREET4 0.91 05/23/2015   FREET4 2.03 (H) 12/21/2014   FREET4 1.99 (H) 09/19/2014   FREET4 1.95 (H) 08/03/2014   FREET4 1.41 08/28/2013  05/20/2018: TSH 3.58, free T4 1.1.   06/20/2016: TSH 0.77, Free T4 1.5, Free T3 2.9 03/22/2016: TSH 2.14, free T4 1.3 (0.8-1.8), free T3 2.8 (2.3-4.2) -all normal >> MMI dose decreased to 2.5 mg daily 02/16/2015: TSH 3.6, free T4 0.9, free T3 2.5 - decreased the methimazole from 10 mg 3 times a day to only 2 times a day. 11/24/2013: TSH 0.86, fT4 1.4, TSI 68 (<140%) 07/02/2012: TSH 3.18, TT4 9.4 (4.5-12) 04/14/2012: TSH 1.81  At last visit TSI antibodies were still elevated, but improved, Lab Results  Component Value Date   TSI 264 (H) 10/13/2018   TSI 305 (H) 05/08/2017   TSI 219 (H) 09/19/2014  11/24/2013: TSI 68 (<140%)  Pt denies: - feeling nodules in neck - hoarseness - dysphagia - choking - SOB with lying down  She sees ophthalmology (Dr. Burgess Estelleanner) >> last visit 2019.  Resolved blurry vision. Feels left  eye larger than the right.  She wears glasses when driving. She had pterygium sx in 03/2017.  Mother had hemithyroidectomy. Sister with thyroid dysfunction.  No family history of thyroid cancer.  ROS: Constitutional: no weight gain/+ weight loss, no fatigue, no subjective hyperthermia, no subjective hypothermia Eyes: no blurry vision, no xerophthalmia ENT: no sore throat, + see HPI Cardiovascular: no CP/no SOB/no palpitations/no leg swelling Respiratory: no cough/no SOB/no wheezing Gastrointestinal: no N/no V/no D/no C/no acid reflux, + AP Musculoskeletal: no muscle aches/no joint aches Skin: no rashes, no hair loss Neurological: no tremors/no numbness/no tingling/no dizziness  I reviewed pt's  medications, allergies, PMH, social hx, family hx, and changes were documented in the history of present illness. Otherwise, unchanged from my initial visit note.   Past Medical History:  Diagnosis Date  . Allergy   . Chicken pox   . Gastritis   . GERD (gastroesophageal reflux disease)   . History of leprosy 07/24/2016  . Hyperthyroidism    x2 years, post childbirth   Past Surgical History:  Procedure Laterality Date  . bone graft dental  09/2018  . COLONOSCOPY     1 polyp per pt " years ago"  . EYE SURGERY Right 03/2016  . UPPER GASTROINTESTINAL ENDOSCOPY     History   Social History  . Marital Status: Married    Spouse Name: N/A    Number of Children: 1   Occupational History  . Credit analyst   Social History Main Topics  . Smoking status: Never Smoker   . Smokeless tobacco: No  . Alcohol Use: No  . Drug Use: No   Social History Narrative   Current Outpatient Medications on File Prior to Visit  Medication Sig Dispense Refill  . cholecalciferol (VITAMIN D) 1000 UNITS tablet Take 1,000 Units by mouth as directed. Three times a day    . clobetasol cream (TEMOVATE) 0.05 %   2  . famotidine (PEPCID) 40 MG tablet Take 1 tablet (40 mg total) by mouth daily. 30 tablet 1  . methimazole (TAPAZOLE) 5 MG tablet TAKE 2 TABLETS(10 MG) BY MOUTH TWICE DAILY 360 tablet 1  . ondansetron (ZOFRAN) 4 MG tablet     . pantoprazole (PROTONIX) 40 MG tablet Take 1 tab twice daily for 2 weeks then 1 tab daily. (Patient not taking: Reported on 04/15/2019) 30 tablet 3  . sucralfate (CARAFATE) 1 g tablet Take 1 tablet (1 g total) by mouth 4 (four) times daily -  with meals and at bedtime for 30 days. 120 tablet 5   Current Facility-Administered Medications on File Prior to Visit  Medication Dose Route Frequency Provider Last Rate Last Dose  . 0.9 %  sodium chloride infusion  500 mL Intravenous Continuous Nandigam, Kavitha V, MD       No Known Allergies Family History  Problem Relation Age  of Onset  . Hyperlipidemia Mother   . Thyroid disease Mother   . Skin cancer Mother   . Diabetes Maternal Grandmother   . Healthy Father   . Thyroid disease Sister   . Colon cancer Neg Hx   . Colon polyps Neg Hx   . Esophageal cancer Neg Hx   . Rectal cancer Neg Hx   . Stomach cancer Neg Hx    PE: LMP 03/26/2019  There is no height or weight on file to calculate BMI.  Wt Readings from Last 3 Encounters:  04/15/19 158 lb (71.7 kg)  04/09/19 158 lb (71.7 kg)  03/24/19 160 lb (  72.6 kg)   Constitutional:  in NAD  The physical exam was not performed (virtual visit).  ASSESSMENT: 1. Graves disease  2. Goiter  PLAN:  1. Patient with history of recurrent Graves' disease.  Her TSI's were still elevated at last visit, but slightly improved.  In 10/2017 her tests were much worse so we had to increase the methimazole dose to 10 mg twice a day.  Subsequently, we were able to decrease the dose including at last visit, to 5 mg twice a day (09/2018) as her TSH actually increased above the upper limit of normal.  She did not return for repeat labs after the last dose change, mostly due to the coronavirus pandemic. -She does not have symptoms of thyrotoxicosis: No unintentional weight loss, palpitations, anxiety, heat intolerance. -We again discussed about modalities for treatment for her Graves' disease, including continuing methimazole, RAI treatment or, last resort, thyroidectomy.  The last 2 modalities will improve her goiter, also.   -At this point, especially since we could not control her disease well only with methimazole, she basically agrees with RAI treatment, however, she would like to wait until the coronavirus pandemic finishes. -For now, continue methimazole at the current dose.  She is tolerating this well.   -No further plans for pregnancy.  Husband had a vasectomy.  She has a 839 year old child. -We will check her TFTs as soon as she can come to the clinic.  She was previously  getting the labs at work, but she has problems with the lab tech finding her veins so she prefers to return to the clinic - I will see her back in 6 months  2. Goiter -Slightly enlarged thyroid, predominantly the left lobe -She denies neck compression symptoms  - time spent with the patient: 15 min, of which >50% was spent in obtaining information about her symptoms, reviewing her previous labs, evaluations, and treatments, counseling her about her conditions (please see the discussed topics above), and developing a plan to further investigate and treat them.  Carlus Pavlovristina Makynleigh Breslin, MD PhD [facility]

## 2019-04-16 ENCOUNTER — Encounter

## 2019-04-16 ENCOUNTER — Ambulatory Visit: Payer: BC Managed Care – PPO | Admitting: Gastroenterology

## 2019-04-19 ENCOUNTER — Telehealth: Payer: Self-pay | Admitting: *Deleted

## 2019-04-19 NOTE — Telephone Encounter (Signed)
1. Have you developed a fever since your procedure? no  2.   Have you had an respiratory symptoms (SOB or cough) since your procedure? no  3.   Have you tested positive for COVID 19 since your procedure no  4.   Have you had any family members/close contacts diagnosed with the COVID 19 since your procedure?  no   If yes to any of these questions please route to Joylene John, RN and Alphonsa Gin, Therapist, sports.  Follow up Call-  Call back number 04/15/2019 12/18/2016  Post procedure Call Back phone  # [phone]  Permission to leave phone message Yes Yes  Some recent data might be hidden     Patient questions:  Do you have a fever, pain , or abdominal swelling? No. Pain Score  0 *  Have you tolerated food without any problems? Yes.    Have you been able to return to your normal activities? Yes.    Do you have any questions about your discharge instructions: Diet   No. Medications  No. Follow up visit  No.  Do you have questions or concerns about your Care? No.  Actions: * If pain score is 4 or above: No action needed, pain <4.

## 2019-04-20 ENCOUNTER — Telehealth: Payer: Self-pay | Admitting: Gastroenterology

## 2019-04-20 ENCOUNTER — Encounter: Payer: Self-pay | Admitting: Gastroenterology

## 2019-04-20 NOTE — Telephone Encounter (Signed)
Read the biopsy letter to her. She is pleased she does not have H Pylori. Lots of questions about her diet contributing to her symptoms.  She wants to know if she can have the jaw surgery with bone implanting. She is concerned about being on antibiotics and her GI symptoms. She asks should she postpone it until she gets her symptoms under control. She is on pro-biotics.

## 2019-04-20 NOTE — Telephone Encounter (Signed)
Pt inquired about EGD path results. °

## 2019-04-21 NOTE — Telephone Encounter (Signed)
Patient is advised.  

## 2019-04-21 NOTE — Telephone Encounter (Signed)
Yes antibiotics can cause GI side effects, no contraindication from GI standpoint if she needs the dental implants.

## 2019-05-25 ENCOUNTER — Encounter: Payer: Self-pay | Admitting: Gastroenterology

## 2019-05-25 ENCOUNTER — Ambulatory Visit (INDEPENDENT_AMBULATORY_CARE_PROVIDER_SITE_OTHER): Payer: BC Managed Care – PPO | Admitting: Gastroenterology

## 2019-05-25 VITALS — Ht 64.0 in

## 2019-05-25 DIAGNOSIS — K589 Irritable bowel syndrome without diarrhea: Secondary | ICD-10-CM

## 2019-05-25 DIAGNOSIS — R109 Unspecified abdominal pain: Secondary | ICD-10-CM | POA: Diagnosis not present

## 2019-05-25 DIAGNOSIS — R140 Abdominal distension (gaseous): Secondary | ICD-10-CM

## 2019-05-25 DIAGNOSIS — K219 Gastro-esophageal reflux disease without esophagitis: Secondary | ICD-10-CM

## 2019-05-25 NOTE — Patient Instructions (Addendum)
Continue with lactose-free and gluten-free diet  Continue Protonix as needed  Antireflux measures  Follow-up office visit in 3 months   Gastroesophageal Reflux Disease, Adult Gastroesophageal reflux (GER) happens when acid from the stomach flows up into the tube that connects the mouth and the stomach (esophagus). Normally, food travels down the esophagus and stays in the stomach to be digested. However, when a person has GER, food and stomach acid sometimes move back up into the esophagus. If this becomes a more serious problem, the person may be diagnosed with a disease called gastroesophageal reflux disease (GERD). GERD occurs when the reflux:  Happens often.  Causes frequent or severe symptoms.  Causes problems such as damage to the esophagus. When stomach acid comes in contact with the esophagus, the acid may cause soreness (inflammation) in the esophagus. Over time, GERD may create small holes (ulcers) in the lining of the esophagus. What are the causes? This condition is caused by a problem with the muscle between the esophagus and the stomach (lower esophageal sphincter, or LES). Normally, the LES muscle closes after food passes through the esophagus to the stomach. When the LES is weakened or abnormal, it does not close properly, and that allows food and stomach acid to go back up into the esophagus. The LES can be weakened by certain dietary substances, medicines, and medical conditions, including:  Tobacco use.  Pregnancy.  Having a hiatal hernia.  Alcohol use.  Certain foods and beverages, such as coffee, chocolate, onions, and peppermint. What increases the risk? You are more likely to develop this condition if you:  Have an increased body weight.  Have a connective tissue disorder.  Use NSAID medicines. What are the signs or symptoms? Symptoms of this condition include:  Heartburn.  Difficult or painful swallowing.  The feeling of having a lump in the  throat.  Abitter taste in the mouth.  Bad breath.  Having a large amount of saliva.  Having an upset or bloated stomach.  Belching.  Chest pain. Different conditions can cause chest pain. Make sure you see your health care provider if you experience chest pain.  Shortness of breath or wheezing.  Ongoing (chronic) cough or a night-time cough.  Wearing away of tooth enamel.  Weight loss. How is this diagnosed? Your health care provider will take a medical history and perform a physical exam. To determine if you have mild or severe GERD, your health care provider may also monitor how you respond to treatment. You may also have tests, including:  A test to examine your stomach and esophagus with a small camera (endoscopy).  A test thatmeasures the acidity level in your esophagus.  A test thatmeasures how much pressure is on your esophagus.  A barium swallow or modified barium swallow test to show the shape, size, and functioning of your esophagus. How is this treated? The goal of treatment is to help relieve your symptoms and to prevent complications. Treatment for this condition may vary depending on how severe your symptoms are. Your health care provider may recommend:  Changes to your diet.  Medicine.  Surgery. Follow these instructions at home: Eating and drinking   Follow a diet as recommended by your health care provider. This may involve avoiding foods and drinks such as: ? Coffee and tea (with or without caffeine). ? Drinks that containalcohol. ? Energy drinks and sports drinks. ? Carbonated drinks or sodas. ? Chocolate and cocoa. ? Peppermint and mint flavorings. ? Garlic and onions. ? Horseradish. ?  Spicy and acidic foods, including peppers, chili powder, curry powder, vinegar, hot sauces, and barbecue sauce. ? Citrus fruit juices and citrus fruits, such as oranges, lemons, and limes. ? Tomato-based foods, such as red sauce, chili, salsa, and pizza with  red sauce. ? Fried and fatty foods, such as donuts, french fries, potato chips, and high-fat dressings. ? High-fat meats, such as hot dogs and fatty cuts of red and white meats, such as rib eye steak, sausage, ham, and bacon. ? High-fat dairy items, such as whole milk, butter, and cream cheese.  Eat small, frequent meals instead of large meals.  Avoid drinking large amounts of liquid with your meals.  Avoid eating meals during the 2-3 hours before bedtime.  Avoid lying down right after you eat.  Do not exercise right after you eat. Lifestyle   Do not use any products that contain nicotine or tobacco, such as cigarettes, e-cigarettes, and chewing tobacco. If you need help quitting, ask your health care provider.  Try to reduce your stress by using methods such as yoga or meditation. If you need help reducing stress, ask your health care provider.  If you are overweight, reduce your weight to an amount that is healthy for you. Ask your health care provider for guidance about a safe weight loss goal. General instructions  Pay attention to any changes in your symptoms.  Take over-the-counter and prescription medicines only as told by your health care provider. Do not take aspirin, ibuprofen, or other NSAIDs unless your health care provider told you to do so.  Wear loose-fitting clothing. Do not wear anything tight around your waist that causes pressure on your abdomen.  Raise (elevate) the head of your bed about 6 inches (15 cm).  Avoid bending over if this makes your symptoms worse.  Keep all follow-up visits as told by your health care provider. This is important. Contact a health care provider if:  You have: ? New symptoms. ? Unexplained weight loss. ? Difficulty swallowing or it hurts to swallow. ? Wheezing or a persistent cough. ? A hoarse voice.  Your symptoms do not improve with treatment. Get help right away if you:  Have pain in your arms, neck, jaw, teeth, or  back.  Feel sweaty, dizzy, or light-headed.  Have chest pain or shortness of breath.  Vomit and your vomit looks like blood or coffee grounds.  Faint.  Have stool that is bloody or black.  Cannot swallow, drink, or eat. Summary  Gastroesophageal reflux happens when acid from the stomach flows up into the esophagus. GERD is a disease in which the reflux happens often, causes frequent or severe symptoms, or causes problems such as damage to the esophagus.  Treatment for this condition may vary depending on how severe your symptoms are. Your health care provider may recommend diet and lifestyle changes, medicine, or surgery.  Contact a health care provider if you have new or worsening symptoms.  Take over-the-counter and prescription medicines only as told by your health care provider. Do not take aspirin, ibuprofen, or other NSAIDs unless your health care provider told you to do so.  Keep all follow-up visits as told by your health care provider. This is important. This information is not intended to replace advice given to you by your health care provider. Make sure you discuss any questions you have with your health care provider. Document Released: 06/26/2005 Document Revised: 03/25/2018 Document Reviewed: 03/25/2018 Elsevier Patient Education  2020 [employer].    Food Choices  for Gastroesophageal Reflux Disease, Adult When you have gastroesophageal reflux disease (GERD), the foods you eat and your eating habits are very important. Choosing the right foods can help ease your discomfort. Think about working with a nutrition specialist (dietitian) to help you make good choices. What are tips for following this plan?  Meals  Choose healthy foods that are low in fat, such as fruits, vegetables, whole grains, low-fat dairy products, and lean meat, fish, and poultry.  Eat small meals often instead of 3 large meals a day. Eat your meals slowly, and in a place where you are relaxed.  Avoid bending over or lying down until 2-3 hours after eating.  Avoid eating meals 2-3 hours before bed.  Avoid drinking a lot of liquid with meals.  Cook foods using methods other than frying. Bake, grill, or broil food instead.  Avoid or limit: ? Chocolate. ? Peppermint or spearmint. ? Alcohol. ? Pepper. ? Black and decaffeinated coffee. ? Black and decaffeinated tea. ? Bubbly (carbonated) soft drinks. ? Caffeinated energy drinks and soft drinks.  Limit high-fat foods such as: ? Fatty meat or fried foods. ? Whole milk, cream, butter, or ice cream. ? Nuts and nut butters. ? Pastries, donuts, and sweets made with butter or shortening.  Avoid foods that cause symptoms. These foods may be different for everyone. Common foods that cause symptoms include: ? Tomatoes. ? Oranges, lemons, and limes. ? Peppers. ? Spicy food. ? Onions and garlic. ? Vinegar. Lifestyle  Maintain a healthy weight. Ask your doctor what weight is healthy for you. If you need to lose weight, work with your doctor to do so safely.  Exercise for at least 30 minutes for 5 or more days each week, or as told by your doctor.  Wear loose-fitting clothes.  Do not smoke. If you need help quitting, ask your doctor.  Sleep with the head of your bed higher than your feet. Use a wedge under the mattress or blocks under the bed frame to raise the head of the bed. Summary  When you have gastroesophageal reflux disease (GERD), food and lifestyle choices are very important in easing your symptoms.  Eat small meals often instead of 3 large meals a day. Eat your meals slowly, and in a place where you are relaxed.  Limit high-fat foods such as fatty meat or fried foods.  Avoid bending over or lying down until 2-3 hours after eating.  Avoid peppermint and spearmint, caffeine, alcohol, and chocolate. This information is not intended to replace advice given to you by your health care provider. Make sure you discuss  any questions you have with your health care provider. Document Released: 03/17/2012 Document Revised: 01/07/2019 Document Reviewed: 10/22/2016 Elsevier Patient Education  2020 [employer]  I appreciate the  opportunity to care for you  Thank You   Marsa ArisKavitha Jasmynn Pfalzgraf , MD

## 2019-05-25 NOTE — Progress Notes (Signed)
Kristina Powell    [id number]    04-05-1976  Primary Care Physician:Wendling, Crosby Oyster, DO  Referring Physician: Shelda Pal, [address] [place],  [place] [address]  This service was provided via  telemedicine due to [name] [facility] pandemic.  I connected with@ on 05/25/19 at  9:40 AM EDT by a video enabled telemedicine application and verified that I am speaking with the correct person using two identifiers.  Patient location: Home Provider location: Office   I discussed the limitations, risks, security and privacy concerns of performing an evaluation and management service by video enabled telemedicine application and the availability of in person appointments. I also discussed with the patient that there may be a patient responsible charge related to this service. The patient expressed understanding and agreed to proceed.   The persons participating in this telemedicine service were myself and the patient     Chief complaint: GERD, IBS, bloating, abdominal pain HPI:  43 year old female with history of GERD, epigastric abdominal pain  Status post EGD with mild gastritis, biopsies negative for H. Pylori  She is doing better with dietary changes, she is avoiding gluten and lactose, has noticed improvement in her symptoms  Currently taking PPI as needed  Denies any nausea, vomiting, abdominal pain, melena or bright red blood per rectum    Outpatient Encounter Medications as of 05/25/2019  Medication Sig  . cholecalciferol (VITAMIN D) 1000 UNITS tablet Take 1,000 Units by mouth as directed. Three times a day  . clobetasol cream (TEMOVATE) 0.05 %   . famotidine (PEPCID) 40 MG tablet Take 1 tablet (40 mg total) by mouth daily.  . methimazole (TAPAZOLE) 5 MG tablet TAKE 2 TABLETS(10 MG) BY MOUTH TWICE DAILY  . ondansetron (ZOFRAN) 4 MG tablet   . pantoprazole (PROTONIX) 40 MG tablet Take 1 tab twice daily for 2 weeks then 1 tab daily.   . pantoprazole (PROTONIX) 40 MG tablet Take 1 tablet (40 mg total) by mouth daily.  . sucralfate (CARAFATE) 1 g tablet Take 1 tablet (1 g total) by mouth 4 (four) times daily -  with meals and at bedtime for 30 days.   Facility-Administered Encounter Medications as of 05/25/2019  Medication  . 0.9 %  sodium chloride infusion    Allergies as of 05/25/2019  . (No Known Allergies)    Past Medical History:  Diagnosis Date  . Allergy   . Chicken pox   . Gastritis   . GERD (gastroesophageal reflux disease)   . History of leprosy 07/24/2016  . Hyperthyroidism    x2 years, post childbirth    Past Surgical History:  Procedure Laterality Date  . bone graft dental  09/2018  . COLONOSCOPY     1 polyp per pt " years ago"  . EYE SURGERY Right 03/2016  . UPPER GASTROINTESTINAL ENDOSCOPY      Family History  Problem Relation Age of Onset  . Hyperlipidemia Mother   . Thyroid disease Mother   . Skin cancer Mother   . Diabetes Maternal Grandmother   . Healthy Father   . Thyroid disease Sister   . Colon cancer Neg Hx   . Colon polyps Neg Hx   . Esophageal cancer Neg Hx   . Rectal cancer Neg Hx   . Stomach cancer Neg Hx     Social History   Socioeconomic History  . Marital status: Married    Spouse name: Not  on file  . Number of children: 1  . Years of education: 2816  . Highest education level: Not on file  Occupational History  . Occupation: [employer]  Social Needs  . Financial resource strain: Not on file  . Food insecurity    Worry: Not on file    Inability: Not on file  . Transportation needs    Medical: Not on file    Non-medical: Not on file  Tobacco Use  . Smoking status: Never Smoker  . Smokeless tobacco: Never Used  Substance and Sexual Activity  . Alcohol use: No  . Drug use: No  . Sexual activity: Yes  Lifestyle  . Physical activity    Days per week: Not on file    Minutes per session: Not on file  . Stress: Not on file  Relationships  . Social  Musicianconnections    Talks on phone: Not on file    Gets together: Not on file    Attends religious service: Not on file    Active member of club or organization: Not on file    Attends meetings of clubs or organizations: Not on file    Relationship status: Not on file  . Intimate partner violence    Fear of current or ex partner: Not on file    Emotionally abused: Not on file    Physically abused: Not on file    Forced sexual activity: Not on file  Other Topics Concern  . Not on file  Social History Narrative   Lives at home w/ her husband and son   Right-handed   Caffeine: 2-3 cups of coffee in the morning      Review of systems: Review of Systems as per HPI All other systems reviewed and are negative.   Observations/Objective:   Data Reviewed:  Reviewed labs, radiology imaging, old records and pertinent past GI work up   Assessment and Plan/Recommendations:  43 year old female with history of Graves' disease and chronic GERD Intermittent epigastric abdominal pain, unclear etiology functional versus IBS Continue with lactose-free and gluten-free diet for possible gluten sensitivity  If persistent symptoms, will consider evaluation for celiac disease  Normal ultrasound, negative for cholecystitis EGD mild gastritis, negative for H. Pylori Continue PPI as needed Antireflux measures  Follow-up as needed   I discussed the assessment and treatment plan with the patient. The patient was provided an opportunity to ask questions and all were answered. The patient agreed with the plan and demonstrated an understanding of the instructions.   The patient was advised to call back or seek an in-person evaluation if the symptoms worsen or if the condition fails to improve as anticipated.     Kristina ArisKavitha Kellen Dutch, MD   CC: Sharlene DoryWendling, Nicholas Paul*

## 2019-05-27 ENCOUNTER — Encounter: Payer: Self-pay | Admitting: Gastroenterology

## 2019-09-28 ENCOUNTER — Other Ambulatory Visit: Payer: Self-pay | Admitting: Internal Medicine

## 2019-09-28 ENCOUNTER — Other Ambulatory Visit: Payer: Self-pay

## 2019-09-28 ENCOUNTER — Other Ambulatory Visit (INDEPENDENT_AMBULATORY_CARE_PROVIDER_SITE_OTHER): Payer: BC Managed Care – PPO

## 2019-09-28 DIAGNOSIS — E0500 Thyrotoxicosis with diffuse goiter without thyrotoxic crisis or storm: Secondary | ICD-10-CM | POA: Diagnosis not present

## 2019-09-28 LAB — TSH: TSH: 4.62 u[IU]/mL — ABNORMAL HIGH (ref 0.35–4.50)

## 2019-09-28 LAB — T4, FREE: Free T4: 0.83 ng/dL (ref 0.60–1.60)

## 2019-09-28 LAB — T3, FREE: T3, Free: 3 pg/mL (ref 2.3–4.2)

## 2019-09-28 MED ORDER — METHIMAZOLE 5 MG PO TABS: ORAL_TABLET | ORAL | 1 refills | Status: DC

## 2019-12-25 ENCOUNTER — Other Ambulatory Visit: Payer: Self-pay | Admitting: Internal Medicine

## 2020-01-06 ENCOUNTER — Other Ambulatory Visit (INDEPENDENT_AMBULATORY_CARE_PROVIDER_SITE_OTHER): Payer: Self-pay

## 2020-01-06 ENCOUNTER — Other Ambulatory Visit: Payer: Self-pay

## 2020-01-06 DIAGNOSIS — E0500 Thyrotoxicosis with diffuse goiter without thyrotoxic crisis or storm: Secondary | ICD-10-CM

## 2020-01-06 LAB — T4, FREE: Free T4: 0.92 ng/dL (ref 0.60–1.60)

## 2020-01-06 LAB — TSH: TSH: 1.58 u[IU]/mL (ref 0.35–4.50)

## 2020-01-06 LAB — T3, FREE: T3, Free: 3.3 pg/mL (ref 2.3–4.2)

## 2020-01-25 ENCOUNTER — Ambulatory Visit (INDEPENDENT_AMBULATORY_CARE_PROVIDER_SITE_OTHER): Payer: BC Managed Care – PPO | Admitting: Family Medicine

## 2020-01-25 ENCOUNTER — Encounter: Payer: Self-pay | Admitting: Family Medicine

## 2020-01-25 ENCOUNTER — Other Ambulatory Visit: Payer: Self-pay

## 2020-01-25 VITALS — BP 118/68 | HR 66 | Temp 96.6°F | Ht 64.0 in | Wt 170.2 lb

## 2020-01-25 DIAGNOSIS — Z1231 Encounter for screening mammogram for malignant neoplasm of breast: Secondary | ICD-10-CM

## 2020-01-25 DIAGNOSIS — Z0000 Encounter for general adult medical examination without abnormal findings: Secondary | ICD-10-CM

## 2020-01-25 DIAGNOSIS — Z114 Encounter for screening for human immunodeficiency virus [HIV]: Secondary | ICD-10-CM | POA: Diagnosis not present

## 2020-01-25 LAB — LIPID PANEL
Cholesterol: 199 mg/dL (ref 0–200)
HDL: 60.7 mg/dL (ref >39.00)
LDL Cholesterol: 119 mg/dL — ABNORMAL HIGH (ref 0–99)
NonHDL: 138.26
Total CHOL/HDL Ratio: 3
Triglycerides: 95 mg/dL (ref 0.0–149.0)
VLDL: 19 mg/dL (ref 0.0–40.0)

## 2020-01-25 LAB — COMPREHENSIVE METABOLIC PANEL
ALT: 15 U/L (ref 0–35)
AST: 15 U/L (ref 0–37)
Albumin: 4.2 g/dL (ref 3.5–5.2)
Alkaline Phosphatase: 70 U/L (ref 39–117)
BUN: 12 mg/dL (ref 6–23)
CO2: 28 mEq/L (ref 19–32)
Calcium: 9.2 mg/dL (ref 8.4–10.5)
Chloride: 104 mEq/L (ref 96–112)
Creatinine, Ser: 0.66 mg/dL (ref 0.40–1.20)
GFR: 97.26 mL/min (ref >60.00)
Glucose, Bld: 83 mg/dL (ref 70–99)
Potassium: 4 mEq/L (ref 3.5–5.1)
Sodium: 137 mEq/L (ref 135–145)
Total Bilirubin: 0.6 mg/dL (ref 0.2–1.2)
Total Protein: 7.3 g/dL (ref 6.0–8.3)

## 2020-01-25 LAB — CBC
HCT: 42.9 % (ref 36.0–46.0)
Hemoglobin: 14.1 g/dL (ref 12.0–15.0)
MCHC: 33 g/dL (ref 30.0–36.0)
MCV: 88.2 fl (ref 78.0–100.0)
Platelets: 264 10*3/uL (ref 150.0–400.0)
RBC: 4.86 Mil/uL (ref 3.87–5.11)
RDW: 13.3 % (ref 11.5–15.5)
WBC: 8.3 10*3/uL (ref 4.0–10.5)

## 2020-01-25 MED ORDER — LEVOCETIRIZINE DIHYDROCHLORIDE 5 MG PO TABS: 5.0000 mg | ORAL_TABLET | Freq: Every evening | ORAL | 2 refills | Status: AC

## 2020-01-25 MED ORDER — FLUTICASONE PROPIONATE 50 MCG/ACT NA SUSP: 2.0000 | Freq: Every day | NASAL | 6 refills | Status: AC

## 2020-01-25 NOTE — Patient Instructions (Addendum)
Keep the diet clean and stay active.  Give [place] 2-3 business days to get the results of your labs back.    Wear the wrist splint at night.  Let us know if you need anything.  Ankle Exercises It is normal to feel mild stretching, pulling, tightness, or discomfort as you do these exercises, but you should stop right away if you feel sudden pain or your pain gets worse.  Stretching and range of motion exercises These exercises warm up your muscles and joints and improve the movement and flexibility of your ankle. These exercises also help to relieve pain, numbness, and tingling. Exercise A: Dorsiflexion/Plantar Flexion   1. Sit with your affected knee straight or bent. Do not rest your foot on anything. 2. Flex your affected ankle to tilt the top of your foot toward your shin. 3. Hold this position for 5 seconds. 4. Point your toes downward to tilt the top of your foot away from your shin. 5. Hold this position for 5 seconds. Repeat 2 times. Complete this exercise 3 times per week. Exercise B: Ankle Alphabet   1. Sit with your affected foot supported at your lower leg. ? Do not rest your foot on anything. ? Make sure your foot has room to move freely. 2. Think of your affected foot as a paintbrush, and move your foot to trace each letter of the alphabet in the air. Keep your hip and knee still while you trace. Make the letters as large as you can without increasing any discomfort. 3. Trace every letter from A to Z. Repeat 2 times. Complete this exercise 3 times per week. Strengthening exercises These exercises build strength and endurance in your ankle. Endurance is the ability to use your muscles for a long time, even after they get tired. Exercise D: Dorsiflexors   1. Secure a rubber exercise band or tube to an object, such as a table leg, that will stay still when the band is pulled. Secure the other end around your affected foot. 2. Sit on the floor, facing the object with your  affected leg extended. The band or tube should be slightly tense when your foot is relaxed. 3. Slowly flex your affected ankle and toes to bring your foot toward you. 4. Hold this position for 3 seconds.  5. Slowly return your foot to the starting position, controlling the band as you do that. Do a total of 10 repetitions. Repeat 2 times. Complete this exercise 3 times per week. Exercise E: Plantar Flexors   1. Sit on the floor with your affected leg extended. 2. Loop a rubber exercise band or tube around the ball of your affected foot. The ball of your foot is on the walking surface, right under your toes. The band or tube should be slightly tense when your foot is relaxed. 3. Slowly point your toes downward, pushing them away from you. 4. Hold this position for 3 seconds. 5. Slowly release the tension in the band or tube, controlling smoothly until your foot is back in the starting position. Repeat for a total of 10 repetitions. Repeat 2 times. Complete this exercise 3 times per week. Exercise F: Towel Curls   1. Sit in a chair on a non-carpeted surface, and put your feet on the floor. 2. Place a towel in front of your feet.  3. Keeping your heel on the floor, put your affected foot on the towel. 4. Pull the towel toward you by grabbing the towel with your  toes and curling them under. Keep your heel on the floor. 5. Let your toes relax. 6. Grab the towel again. Keep going until the towel is completely underneath your foot. Repeat for a total of 10 repetitions. Repeat 2 times. Complete this exercise 3 times per week. Exercise G: Heel Raise ( Plantar Flexors, Standing)    1. Stand with your feet shoulder-width apart. 2. Keep your weight spread evenly over the width of your feet while you rise up on your toes. Use a wall or table to steady yourself, but try not to use it for support. 3. If this exercise is too easy, try these options: ? Shift your weight toward your affected leg  until you feel challenged. ? If told by your health care provider, lift your uninjured leg off the floor. 4. Hold this position for 3 seconds. Repeat for a total of 10 repetitions. Repeat 2 times. Complete this exercise 3 times per week. Exercise H: Tandem Walking 1. Stand with one foot directly in front of the other. 2. Slowly raise your back foot up, lifting your heel before your toes, and place it directly in front of your other foot. 3. Continue to walk in this heel-to-toe way for 10 steps or for as long as told by your health care provider. Have a countertop or wall nearby to use if needed to keep your balance, but try not to hold onto anything for support. Repeat 2 times. Complete this exercises 3 times per week. Make sure you discuss any questions you have with your health care provider. Document Released: 07/31/2005 Document Revised: 05/16/2016 Document Reviewed: 06/04/2015 Elsevier Interactive Patient Education  2018 [employer].

## 2020-01-25 NOTE — Progress Notes (Signed)
Chief Complaint  Patient presents with  . Annual Exam  . Breast Pain  . Allergies     Well Woman Kristina Powell is here for a complete physical.   Her last physical was >1 year ago.  Current diet: in general, diet could be better. Current exercise: not a whole lot right now. Weight is increased and she confirms daytime fatigue. Seatbelt? Yes  Health Maintenance Pap/HPV- Yes Mammogram- No Tetanus- does not remember, thinks she had it done HIV screening- Yes  Past Medical History:  Diagnosis Date  . Allergy   . Chicken pox   . Gastritis   . GERD (gastroesophageal reflux disease)   . Hyperthyroidism    x2 years, post childbirth     Past Surgical History:  Procedure Laterality Date  . bone graft dental  09/2018  . COLONOSCOPY     1 polyp per pt " years ago"  . EYE SURGERY Right 03/2016  . UPPER GASTROINTESTINAL ENDOSCOPY      Medications  Current Outpatient Medications on File Prior to Visit  Medication Sig Dispense Refill  . cholecalciferol (VITAMIN D) 1000 UNITS tablet Take 1,000 Units by mouth as directed. Three times a day    . clobetasol cream (TEMOVATE) 0.05 %   2  . methimazole (TAPAZOLE) 5 MG tablet TAKE 2 TABLETS(10 MG) BY MOUTH TWICE DAILY 360 tablet 1   Allergies No Known Allergies  Review of Systems: Constitutional:  no unexpected weight changes Eye:  no recent significant change in vision Ear/Nose/Mouth/Throat:  Ears:  no recent change in hearing Nose/Mouth/Throat:  no complaints of nasal congestion, no sore throat Cardiovascular: no chest pain Respiratory:  no shortness of breath Gastrointestinal:  no abdominal pain, no change in bowel habits GU:  Female: negative for dysuria or pelvic pain Musculoskeletal/Extremities: +chronic R ankle pain; otherwise no pain of the joints Integumentary (Skin/Breast):  no abnormal skin lesions reported Neurologic: +tingling of L hand in AM and at night Endocrine:  denies fatigue Hematologic/Lymphatic:  No areas of  easy bleeding  Exam BP 118/68 (BP Location: Left Arm, Patient Position: Sitting, Cuff Size: Normal)   Pulse 66   Temp (!) 96.6 F (35.9 C) (Temporal)   Ht 5\' 4"  (1.626 m)   Wt 170 lb 4 oz (77.2 kg)   SpO2 98%   BMI 29.22 kg/m  General:  well developed, well nourished, in no apparent distress Skin:  no significant moles, warts, or growths Head:  no masses, lesions, or tenderness Eyes:  pupils equal and round, sclera anicteric without injection Ears:  canals without lesions, TMs shiny without retraction, no obvious effusion, no erythema Nose:  nares patent, septum midline, mucosa normal, and no drainage or sinus tenderness Throat/Pharynx:  lips and gingiva without lesion; tongue and uvula midline; non-inflamed pharynx; no exudates or postnasal drainage Neck: neck supple without adenopathy, thyromegaly, or masses Lungs:  clear to auscultation, breath sounds equal bilaterally, no respiratory distress Cardio:  regular rate and rhythm, no LE edema Abdomen:  abdomen soft, nontender; bowel sounds normal; no masses or organomegaly Genital: Defer to GYN Musculoskeletal:  symmetrical muscle groups noted without atrophy or deformity Extremities:  no clubbing, cyanosis, or edema, no deformities, no skin discoloration Neuro:  gait normal; deep tendon reflexes normal and symmetric Psych: well oriented with normal range of affect and appropriate judgment/insight  Assessment and Plan  Well adult exam - Plan: Lipid panel, CBC, Comprehensive metabolic panel  Encounter for screening mammogram for malignant neoplasm of breast - Plan: MM DIGITAL  SCREENING BILATERAL  Screening for HIV (human immunodeficiency virus) - Plan: HIV Antibody (routine testing w rflx)   Well 44 y.o. female. Counseled on diet and exercise. Shared decision making performed regarding mammograms for breast cancer screening and limitations in younger females. Stretches/exercises for R ankle, brace for L wrist (likely CTS).   Other orders as above. Follow up in 1 mo to reck hand and ankle The patient voiced understanding and agreement to the plan.  Jilda Roche [place], DO 01/25/20 12:33 PM

## 2020-01-26 ENCOUNTER — Other Ambulatory Visit: Payer: Self-pay | Admitting: Family Medicine

## 2020-01-26 DIAGNOSIS — E785 Hyperlipidemia, unspecified: Secondary | ICD-10-CM

## 2020-01-26 LAB — HIV ANTIBODY (ROUTINE TESTING W REFLEX): HIV 1&2 Ab, 4th Generation: NONREACTIVE

## 2020-02-02 ENCOUNTER — Other Ambulatory Visit: Payer: Self-pay

## 2020-02-02 ENCOUNTER — Ambulatory Visit (HOSPITAL_BASED_OUTPATIENT_CLINIC_OR_DEPARTMENT_OTHER)
Admission: RE | Admit: 2020-02-02 | Discharge: 2020-02-02 | Disposition: A | Discharge status: Home / Self Care | Payer: BC Managed Care – PPO | Source: Ambulatory Visit | Attending: Family Medicine | Admitting: Family Medicine

## 2020-02-02 DIAGNOSIS — Z1231 Encounter for screening mammogram for malignant neoplasm of breast: Secondary | ICD-10-CM | POA: Insufficient documentation

## 2020-02-04 ENCOUNTER — Other Ambulatory Visit: Payer: Self-pay | Admitting: Family Medicine

## 2020-02-04 DIAGNOSIS — R928 Other abnormal and inconclusive findings on diagnostic imaging of breast: Secondary | ICD-10-CM

## 2020-02-21 ENCOUNTER — Other Ambulatory Visit: Payer: Self-pay

## 2020-02-21 ENCOUNTER — Ambulatory Visit
Admission: RE | Admit: 2020-02-21 | Discharge: 2020-02-21 | Disposition: A | Discharge status: Home / Self Care | Payer: BC Managed Care – PPO | Source: Ambulatory Visit | Attending: Family Medicine | Admitting: Family Medicine

## 2020-02-21 DIAGNOSIS — N6011 Diffuse cystic mastopathy of right breast: Secondary | ICD-10-CM | POA: Diagnosis not present

## 2020-02-21 DIAGNOSIS — N6012 Diffuse cystic mastopathy of left breast: Secondary | ICD-10-CM | POA: Diagnosis not present

## 2020-02-21 DIAGNOSIS — R928 Other abnormal and inconclusive findings on diagnostic imaging of breast: Secondary | ICD-10-CM

## 2020-02-21 DIAGNOSIS — R922 Inconclusive mammogram: Secondary | ICD-10-CM | POA: Diagnosis not present

## 2020-04-26 ENCOUNTER — Telehealth: Payer: Self-pay | Admitting: Family Medicine

## 2020-04-26 ENCOUNTER — Other Ambulatory Visit: Payer: Self-pay

## 2020-04-26 ENCOUNTER — Other Ambulatory Visit (INDEPENDENT_AMBULATORY_CARE_PROVIDER_SITE_OTHER): Payer: BC Managed Care – PPO

## 2020-04-26 DIAGNOSIS — E785 Hyperlipidemia, unspecified: Secondary | ICD-10-CM

## 2020-04-26 DIAGNOSIS — E0500 Thyrotoxicosis with diffuse goiter without thyrotoxic crisis or storm: Secondary | ICD-10-CM | POA: Diagnosis not present

## 2020-04-26 DIAGNOSIS — R1013 Epigastric pain: Secondary | ICD-10-CM

## 2020-04-26 LAB — LIPID PANEL
Cholesterol: 169 mg/dL (ref 0–200)
HDL: 52.2 mg/dL (ref >39.00)
LDL Cholesterol: 104 mg/dL — ABNORMAL HIGH (ref 0–99)
NonHDL: 116.57
Total CHOL/HDL Ratio: 3
Triglycerides: 61 mg/dL (ref 0.0–149.0)
VLDL: 12.2 mg/dL (ref 0.0–40.0)

## 2020-04-26 LAB — TSH: TSH: 2.49 u[IU]/mL (ref 0.35–4.50)

## 2020-04-26 MED ORDER — CLOBETASOL PROPIONATE 0.05 % EX CREA: TOPICAL_CREAM | Freq: Two times a day (BID) | CUTANEOUS | 2 refills | Status: AC

## 2020-04-26 MED ORDER — FAMOTIDINE 40 MG PO TABS: 40.0000 mg | ORAL_TABLET | Freq: Every day | ORAL | 2 refills | Status: AC

## 2020-04-26 NOTE — Telephone Encounter (Signed)
Patient came into office asking for a refill on FAMOTIDINE 40MG  TABLET & CLOBETASOL PROPIONATE 30G. Please advise

## 2020-04-26 NOTE — Telephone Encounter (Signed)
Refilled clobetasol, famotidine off her current list.

## 2020-10-19 ENCOUNTER — Telehealth: Payer: Self-pay | Admitting: Internal Medicine

## 2020-10-19 NOTE — Telephone Encounter (Signed)
Last OV 04/15/2019 next appt scheduled 2/22

## 2020-10-19 NOTE — Telephone Encounter (Signed)
OK 

## 2020-10-19 NOTE — Telephone Encounter (Signed)
Patient called and requested a refill of methimazole (TAPAZOLE) 5 MG tablet to [facility] on [address]

## 2020-10-20 ENCOUNTER — Other Ambulatory Visit: Payer: Self-pay | Admitting: Internal Medicine

## 2020-10-20 NOTE — Telephone Encounter (Signed)
Last OV: 04/15/2019

## 2020-11-21 ENCOUNTER — Ambulatory Visit (INDEPENDENT_AMBULATORY_CARE_PROVIDER_SITE_OTHER): Payer: 59 | Admitting: Internal Medicine

## 2020-11-21 ENCOUNTER — Encounter: Payer: Self-pay | Admitting: Internal Medicine

## 2020-11-21 ENCOUNTER — Other Ambulatory Visit: Payer: Self-pay

## 2020-11-21 VITALS — BP 120/80 | HR 97 | Ht 64.0 in | Wt 171.8 lb

## 2020-11-21 DIAGNOSIS — E0500 Thyrotoxicosis with diffuse goiter without thyrotoxic crisis or storm: Secondary | ICD-10-CM

## 2020-11-21 DIAGNOSIS — E049 Nontoxic goiter, unspecified: Secondary | ICD-10-CM | POA: Diagnosis not present

## 2020-11-21 MED ORDER — METHIMAZOLE 5 MG PO TABS: 5.0000 mg | ORAL_TABLET | Freq: Every day | ORAL | 3 refills | Status: DC

## 2020-11-21 NOTE — Patient Instructions (Signed)
Please stop at the lab.  Continue methimazole 5 mg once a day.  Please come back for a follow-up appointment in 1 year but for labs in 6 months.

## 2020-11-21 NOTE — Progress Notes (Signed)
Patient ID: Kristina Powell, female   DOB: Jun 21, 1976, 45 y.o.   MRN: [id number]  This visit occurred during the SARS-CoV-2 public health emergency.  Safety protocols were in place, including screening questions prior to the visit, additional usage of staff PPE, and extensive cleaning of exam room while observing appropriate contact time as indicated for disinfecting solutions.   HPI  Kristina Powell is a 45 y.o.-year-old female, presenting for management of Graves ds. Last visit 1 year and 7 months ago. Work Health Provider: Claude Manges, FNP, RD, LDN  At last visit, she was having stomach pain and but symptoms improved after she started a gluten-free diet.  She feels that stress also contributed.  She continues to work from home.  Reviewed history: Pt was dx with hyperthyroidism in 2005 >> developed this after she had her child >> saw PCP in [place], where she lived at the time >> dx with Graves ds. (unclear if she had an uptake and scan then, but likely) >> tx with MMI from 2012-2014 >> stopped 06/2013 >> we had to restart methimazole in 2015.  In the past, we were able to decrease her methimazole to 2.5 mg daily but that we had to increase it up to 10 mg twice a day.  09/2018: Methimazole dose decreased to 5 mg twice a day 08/2019: Methimazole dose decreased to 5 mg daily  Reviewed her TFTs: Lab Results  Component Value Date   TSH 2.49 04/26/2020   TSH 1.58 01/06/2020   TSH 4.62 (H) 09/28/2019   TSH 5.69 (H) 10/13/2018   TSH 2.870 08/14/2018   TSH <0.006 (L) 11/07/2017   TSH 0.30 (L) 05/08/2017   TSH 0.85 10/24/2016   TSH 0.84 01/22/2016   TSH 1.52 10/23/2015   FREET4 0.92 01/06/2020   FREET4 0.83 09/28/2019   FREET4 0.83 10/13/2018   FREET4 1.18 08/14/2018   FREET4 4.71 (H) 11/07/2017   FREET4 1.16 05/08/2017   FREET4 0.97 01/22/2016   FREET4 0.91 05/23/2015   FREET4 2.03 (H) 12/21/2014   FREET4 1.99 (H) 09/19/2014  05/20/2018: TSH 3.58, free T4 1.1.   06/20/2016: TSH 0.77, Free T4 1.5,  Free T3 2.9 03/22/2016: TSH 2.14, free T4 1.3 (0.8-1.8), free T3 2.8 (2.3-4.2) -all normal >> MMI dose decreased to 2.5 mg daily 02/16/2015: TSH 3.6, free T4 0.9, free T3 2.5 - decreased the methimazole from 10 mg 3 times a day to only 2 times a day. 11/24/2013: TSH 0.86, fT4 1.4, TSI 68 (<140%) 07/02/2012: TSH 3.18, TT4 9.4 (4.5-12) 04/14/2012: TSH 1.81  TSI's were previously elevated but improved: Lab Results  Component Value Date   TSI 264 (H) 10/13/2018   TSI 305 (H) 05/08/2017   TSI 219 (H) 09/19/2014  11/24/2013: TSI 68 (<140%)  Pt denies: - feeling nodules in neck - hoarseness - dysphagia - choking - SOB with lying down  She sees ophthalmology (Dr. Burgess Estelle) >> last visit 2019.  She had blurry vision in the past, but this resolved before last visit. Feels left eye larger than the right.  She wears glasses when driving. She had pterygium sx in 03/2017.  Mother had hemithyroidectomy. Sister with thyroid dysfunction.  No family history of thyroid cancer.  ROS: Constitutional: no weight gain/no weight loss, no fatigue, no subjective hyperthermia, no subjective hypothermia Eyes: no blurry vision, no xerophthalmia ENT: no sore throat, + see HPI Cardiovascular: no CP/no SOB/no palpitations/no leg swelling Respiratory: no cough/no SOB/no wheezing Gastrointestinal: no N/no V/no D/no C/no acid reflux Musculoskeletal: no muscle  aches/no joint aches Skin: no rashes, no hair loss Neurological: no tremors/no numbness/no tingling/no dizziness  I reviewed pt's medications, allergies, PMH, social hx, family hx, and changes were documented in the history of present illness. Otherwise, unchanged from my initial visit note.   Past Medical History:  Diagnosis Date  . Allergy   . Chicken pox   . Gastritis   . GERD (gastroesophageal reflux disease)   . Hyperthyroidism    x2 years, post childbirth   Past Surgical History:  Procedure Laterality Date  . bone graft dental  09/2018  .  COLONOSCOPY     1 polyp per pt " years ago"  . EYE SURGERY Right 03/2016  . UPPER GASTROINTESTINAL ENDOSCOPY     History   Social History  . Marital Status: Married    Spouse Name: N/A    Number of Children: 1   Occupational History  . Credit analyst   Social History Main Topics  . Smoking status: Never Smoker   . Smokeless tobacco: No  . Alcohol Use: No  . Drug Use: No   Social History Narrative   Current Outpatient Medications on File Prior to Visit  Medication Sig Dispense Refill  . cholecalciferol (VITAMIN D) 1000 UNITS tablet Take 1,000 Units by mouth as directed. Three times a day    . clobetasol cream (TEMOVATE) 0.05 % Apply topically 2 (two) times daily. 30 g 2  . famotidine (PEPCID) 40 MG tablet Take 1 tablet (40 mg total) by mouth daily. 90 tablet 2  . fluticasone (FLONASE) 50 MCG/ACT nasal spray Place 2 sprays into both nostrils daily. 16 g 6  . levocetirizine (XYZAL) 5 MG tablet Take 1 tablet (5 mg total) by mouth every evening. 30 tablet 2  . methimazole (TAPAZOLE) 5 MG tablet TAKE 2 TABLETS(10 MG) BY MOUTH TWICE DAILY 120 tablet 0   Current Facility-Administered Medications on File Prior to Visit  Medication Dose Route Frequency Provider Last Rate Last Admin  . 0.9 %  sodium chloride infusion  500 mL Intravenous Continuous Nandigam, Kavitha V, MD       No Known Allergies Family History  Problem Relation Age of Onset  . Hyperlipidemia Mother   . Thyroid disease Mother   . Skin cancer Mother   . Diabetes Maternal Grandmother   . Healthy Father   . Thyroid disease Sister   . Colon cancer Neg Hx   . Colon polyps Neg Hx   . Esophageal cancer Neg Hx   . Rectal cancer Neg Hx   . Stomach cancer Neg Hx    PE: BP 120/80   Pulse 97   Ht 5\' 4"  (1.626 m)   Wt 171 lb 12.8 oz (77.9 kg)   SpO2 99%   BMI 29.49 kg/m  Body mass index is 29.49 kg/m.  Wt Readings from Last 3 Encounters:  11/21/20 171 lb 12.8 oz (77.9 kg)  01/25/20 170 lb 4 oz (77.2 kg)   04/15/19 158 lb (71.7 kg)   Constitutional: Slightly overweight, in NAD Eyes: PERRLA, EOMI, no exophthalmos ENT: moist mucous membranes, no thyromegaly, no cervical lymphadenopathy Cardiovascular: tachycardia, RR, No MRG Respiratory: CTA B Gastrointestinal: abdomen soft, NT, ND, BS+ Musculoskeletal: no deformities, strength intact in all 4 Skin: moist, warm, no rashes Neurological: no tremor with outstretched hands, DTR normal in all 4   ASSESSMENT: 1. Graves disease  -No further plans for pregnancy.  Husband had a vasectomy.  She has a 56 year old child.  2. Goiter  PLAN:  1. Patient with history of recurrent Graves' disease, returning after long absence of 1 year and 7 months. -Previously on higher dose of methimazole, but decreased to 5 mg daily in 08/2019.   -Latest TFTs were reviewed and these were normal in 03/2020 -Her TSI's were still elevated at last check, but slightly improved.   -We did discuss about potentially using RAI treatment, but she wanted to hold off this until the coronavirus pandemic winds down. -At today's visit, she denies symptoms of thyrotoxicosis: Unintentional weight loss, palpitations, anxiety, heat intolerance.  Her heart rate was slightly elevated on arrival, but it improved to normal towards the end of the visit. -At this visit, since she requires a stable, low  dose of methimazole, it is reasonable to try to decrease the dose if possible and if her thyrotoxicosis recurs, then to attempt RAI treatment. -She does not have symptoms of thyrotoxicosis: No unintentional weight loss, palpitations, anxiety, heat intolerance. -For now, we will recheck her TFTs and will add TSI's and change the methimazole dose accordingly after the results return -I will see her back in a year afterwards, but in 6 months for labs  2. Goiter -Slightly enlarged thyroid, predominantly in the left lobe -She denies neck compression symptoms  Component     Latest Ref Rng &  Units 11/21/2020  T4,Free(Direct)     0.60 - 1.60 ng/dL 1.69  Triiodothyronine,Free,Serum     2.3 - 4.2 pg/mL 4.0  TSH     0.35 - 4.50 uIU/mL 1.18  TFTs are normal.  TSI's are pending.  Carlus Pavlov, MD PhD [facility] Endocrinology

## 2020-11-22 ENCOUNTER — Encounter: Payer: Self-pay | Admitting: Internal Medicine

## 2020-11-22 LAB — T3, FREE: T3, Free: 4 pg/mL (ref 2.3–4.2)

## 2020-11-22 LAB — TSH: TSH: 1.18 u[IU]/mL (ref 0.35–4.50)

## 2020-11-22 LAB — T4, FREE: Free T4: 1.08 ng/dL (ref 0.60–1.60)

## 2020-11-24 LAB — THYROID STIMULATING IMMUNOGLOBULIN: TSI: 122 % baseline (ref <140)

## 2021-02-27 ENCOUNTER — Other Ambulatory Visit (HOSPITAL_COMMUNITY)
Admission: RE | Admit: 2021-02-27 | Discharge: 2021-02-27 | Disposition: A | Discharge status: Home / Self Care | Payer: 59 | Source: Ambulatory Visit | Attending: Family Medicine | Admitting: Family Medicine

## 2021-02-27 ENCOUNTER — Other Ambulatory Visit: Payer: Self-pay | Admitting: Family Medicine

## 2021-02-27 DIAGNOSIS — Z13220 Encounter for screening for lipoid disorders: Secondary | ICD-10-CM | POA: Diagnosis not present

## 2021-02-27 DIAGNOSIS — Z01411 Encounter for gynecological examination (general) (routine) with abnormal findings: Secondary | ICD-10-CM | POA: Insufficient documentation

## 2021-02-27 DIAGNOSIS — Z124 Encounter for screening for malignant neoplasm of cervix: Secondary | ICD-10-CM | POA: Diagnosis not present

## 2021-02-27 DIAGNOSIS — E0500 Thyrotoxicosis with diffuse goiter without thyrotoxic crisis or storm: Secondary | ICD-10-CM | POA: Diagnosis not present

## 2021-02-27 DIAGNOSIS — Z0000 Encounter for general adult medical examination without abnormal findings: Secondary | ICD-10-CM | POA: Diagnosis not present

## 2021-02-27 DIAGNOSIS — B353 Tinea pedis: Secondary | ICD-10-CM | POA: Diagnosis not present

## 2021-02-27 DIAGNOSIS — Z1211 Encounter for screening for malignant neoplasm of colon: Secondary | ICD-10-CM | POA: Diagnosis not present

## 2021-02-27 DIAGNOSIS — Z79899 Other long term (current) drug therapy: Secondary | ICD-10-CM | POA: Diagnosis not present

## 2021-03-02 LAB — CYTOLOGY - PAP
Comment: NEGATIVE
Diagnosis: NEGATIVE
High risk HPV: NEGATIVE

## 2021-03-20 ENCOUNTER — Other Ambulatory Visit: Payer: Self-pay | Admitting: Family Medicine

## 2021-03-20 DIAGNOSIS — Z1231 Encounter for screening mammogram for malignant neoplasm of breast: Secondary | ICD-10-CM

## 2021-03-22 ENCOUNTER — Ambulatory Visit: Payer: 59

## 2021-03-27 ENCOUNTER — Telehealth: Payer: Self-pay | Admitting: Internal Medicine

## 2021-03-27 NOTE — Telephone Encounter (Signed)
REFILL REQUEST -   Methimazole  PHARMACY -   [facility] [address] [place], [place] [address] Phone: [phone]

## 2021-03-28 ENCOUNTER — Other Ambulatory Visit: Payer: Self-pay

## 2021-03-28 MED ORDER — METHIMAZOLE 5 MG PO TABS: 5.0000 mg | ORAL_TABLET | Freq: Every day | ORAL | 3 refills | Status: DC

## 2021-03-28 NOTE — Telephone Encounter (Signed)
Sent refill to the pharmacy  [facility] per pt request.

## 2021-04-04 ENCOUNTER — Other Ambulatory Visit: Payer: Self-pay | Admitting: Internal Medicine

## 2021-04-13 ENCOUNTER — Ambulatory Visit
Admission: RE | Admit: 2021-04-13 | Discharge: 2021-04-13 | Disposition: A | Discharge status: Home / Self Care | Payer: 59 | Source: Ambulatory Visit | Attending: Family Medicine | Admitting: Family Medicine

## 2021-04-13 ENCOUNTER — Other Ambulatory Visit: Payer: Self-pay

## 2021-04-13 DIAGNOSIS — Z1231 Encounter for screening mammogram for malignant neoplasm of breast: Secondary | ICD-10-CM

## 2021-05-17 ENCOUNTER — Other Ambulatory Visit: Payer: Self-pay | Admitting: Internal Medicine

## 2021-05-17 DIAGNOSIS — E0500 Thyrotoxicosis with diffuse goiter without thyrotoxic crisis or storm: Secondary | ICD-10-CM

## 2021-05-21 ENCOUNTER — Other Ambulatory Visit (INDEPENDENT_AMBULATORY_CARE_PROVIDER_SITE_OTHER): Payer: 59

## 2021-05-21 ENCOUNTER — Other Ambulatory Visit: Payer: Self-pay

## 2021-05-21 DIAGNOSIS — E0500 Thyrotoxicosis with diffuse goiter without thyrotoxic crisis or storm: Secondary | ICD-10-CM | POA: Diagnosis not present

## 2021-05-21 LAB — T3, FREE: T3, Free: 3.5 pg/mL (ref 2.3–4.2)

## 2021-05-21 LAB — TSH: TSH: 0.21 u[IU]/mL — ABNORMAL LOW (ref 0.35–5.50)

## 2021-05-22 ENCOUNTER — Encounter: Payer: Self-pay | Admitting: Internal Medicine

## 2021-05-22 LAB — T4, FREE: Free T4: 1.22 ng/dL (ref 0.60–1.60)

## 2021-09-27 IMAGING — MG MM DIGITAL SCREENING BILAT W/ TOMO AND CAD
6 of 12 series · 6 of 36 positions shown · non-contrast
Comparison: Previous exam(s).

CLINICAL DATA: Screening. History of bilateral breast cysts.

EXAM:
DIGITAL SCREENING BILATERAL MAMMOGRAM WITH TOMOSYNTHESIS AND CAD
TECHNIQUE: Bilateral screening digital craniocaudal and mediolateral oblique
mammograms were obtained. Bilateral screening digital breast
tomosynthesis was performed. The images were evaluated with
computer-aided detection.

[R MLO synth-2D (1 of 2)]
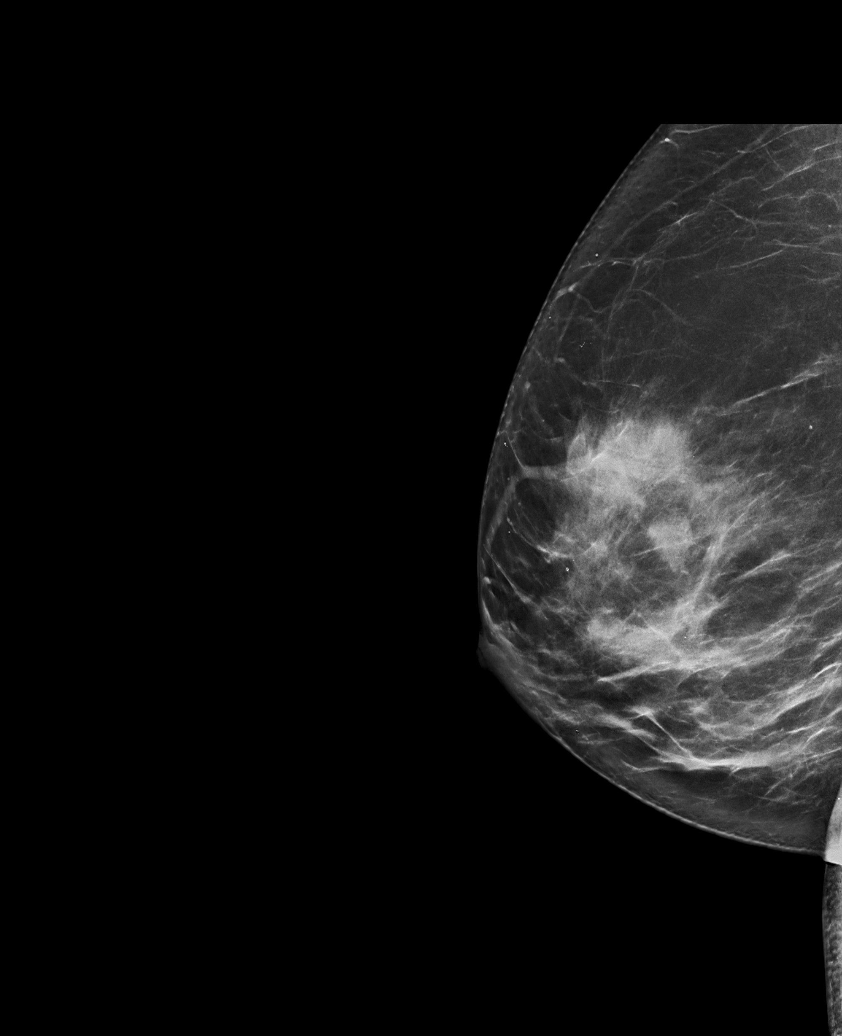

[R MLO synth-2D (2 of 2)]
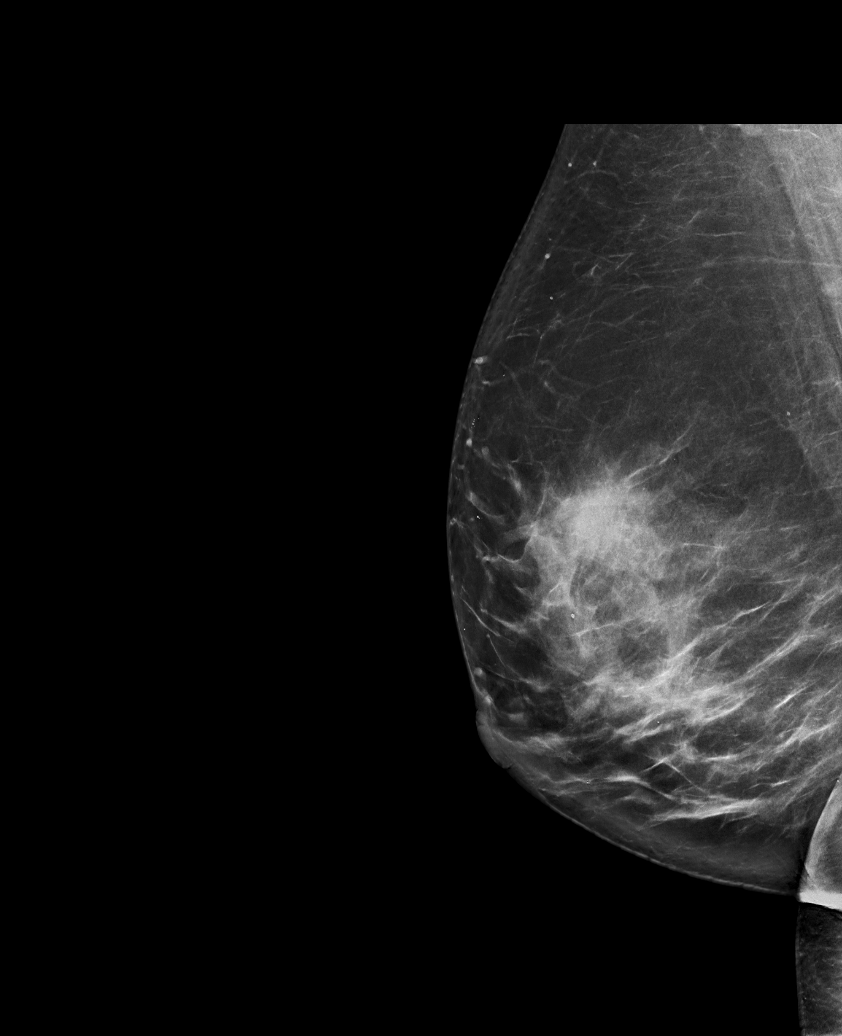

[L MLO synth-2D (1 of 2)]
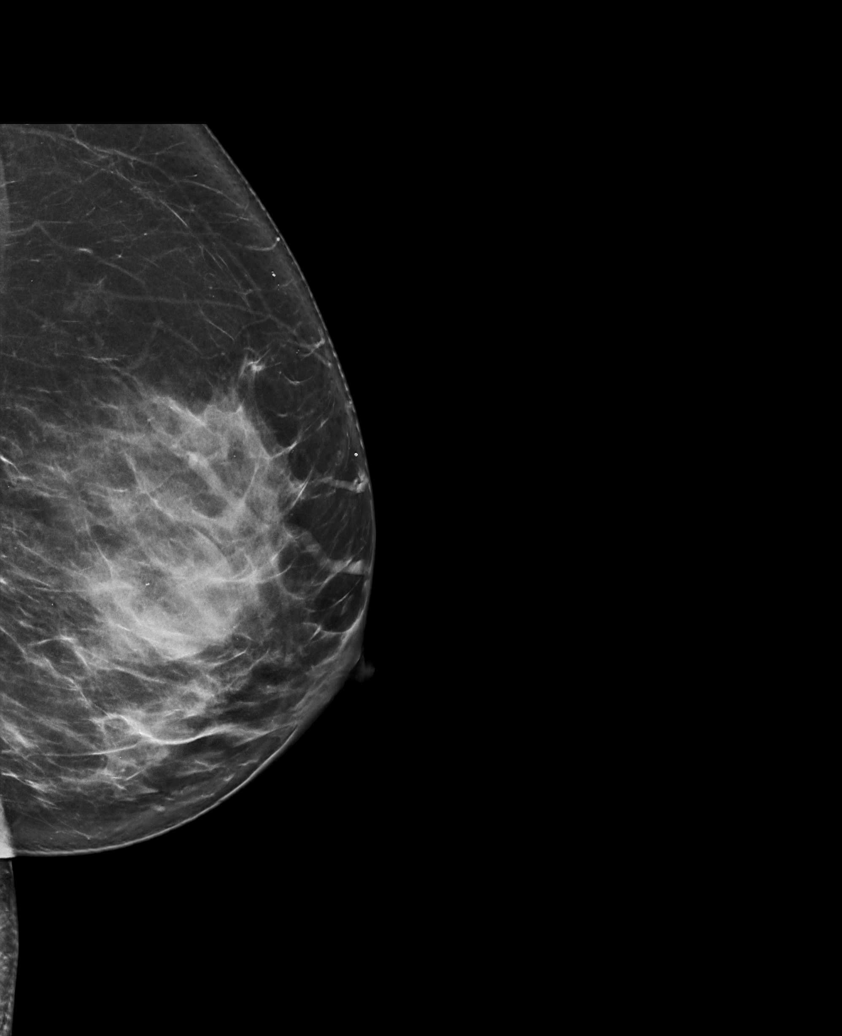

[L CC synth-2D]
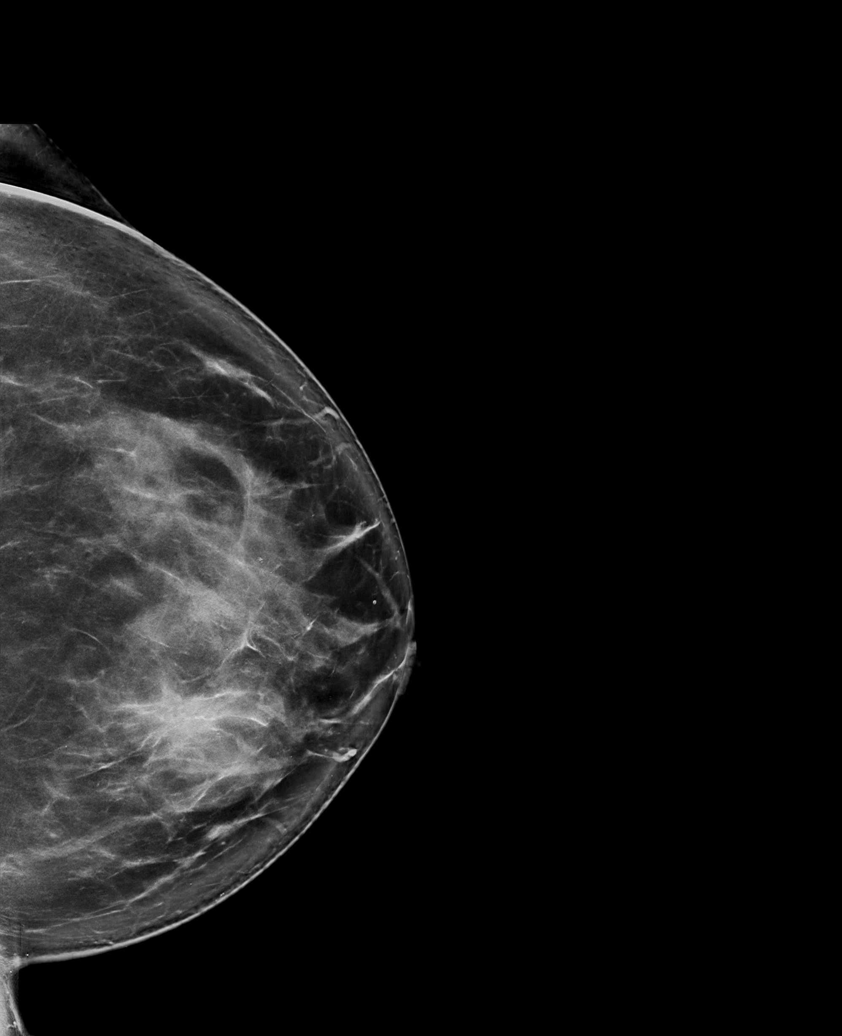

[L MLO synth-2D (2 of 2)]
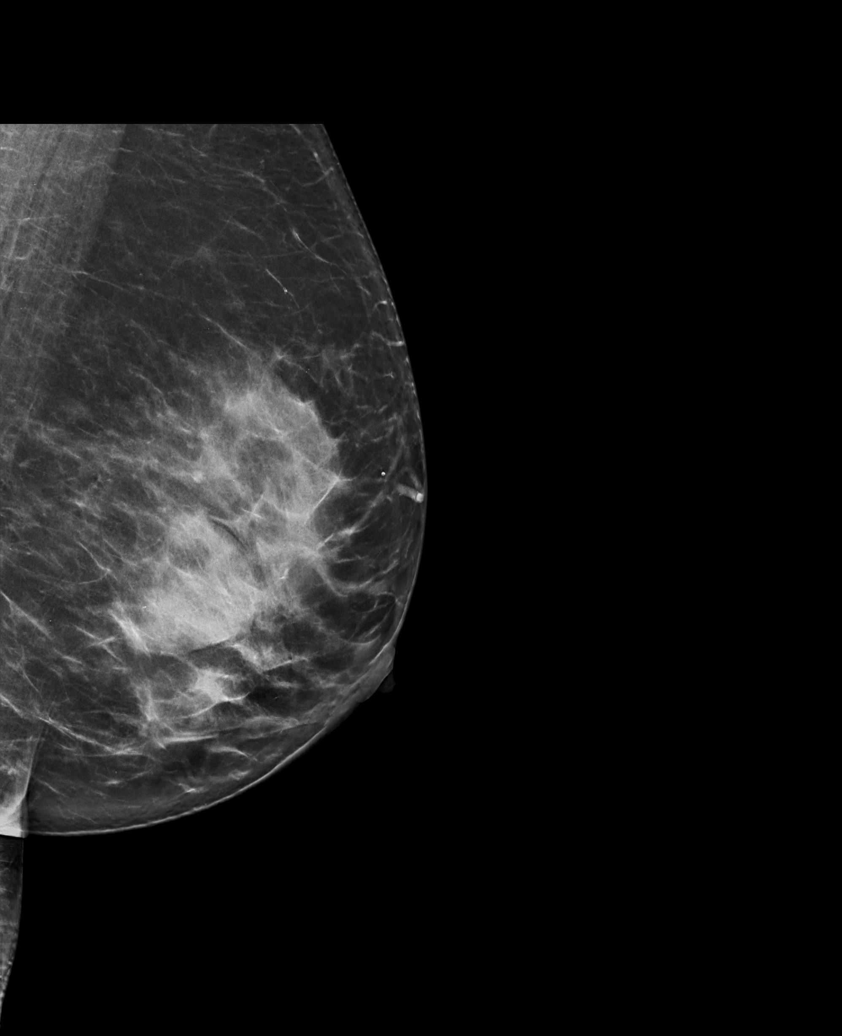

[R CC synth-2D]
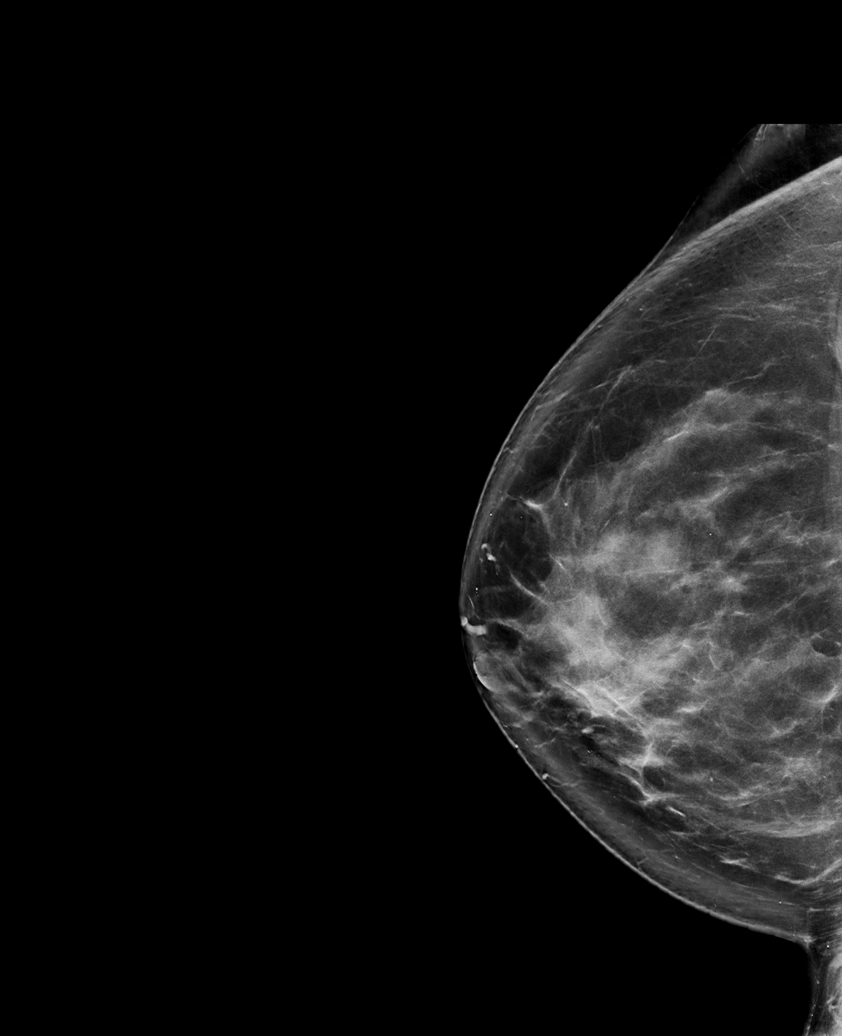

[6 of 36 positions shown; findings below may reference images not displayed]

ACR Breast Density Category c: The breast tissue is heterogeneously
dense, which may obscure small masses.
FINDINGS: There are no findings suspicious for malignancy.
IMPRESSION: No mammographic evidence of malignancy. A result letter of this
screening mammogram will be mailed directly to the patient.

RECOMMENDATION:
Screening mammogram in one year. (Code:B7-6-C8E)

BI-RADS CATEGORY  1: Negative.

## 2021-11-08 ENCOUNTER — Other Ambulatory Visit: Payer: 59

## 2021-11-14 ENCOUNTER — Other Ambulatory Visit: Payer: Self-pay | Admitting: Internal Medicine

## 2021-11-14 DIAGNOSIS — E0500 Thyrotoxicosis with diffuse goiter without thyrotoxic crisis or storm: Secondary | ICD-10-CM

## 2021-11-15 ENCOUNTER — Other Ambulatory Visit (INDEPENDENT_AMBULATORY_CARE_PROVIDER_SITE_OTHER): Payer: 59

## 2021-11-15 ENCOUNTER — Other Ambulatory Visit: Payer: Self-pay

## 2021-11-15 DIAGNOSIS — E0500 Thyrotoxicosis with diffuse goiter without thyrotoxic crisis or storm: Secondary | ICD-10-CM

## 2021-11-16 ENCOUNTER — Encounter: Payer: Self-pay | Admitting: Internal Medicine

## 2021-11-16 LAB — T4, FREE: Free T4: 1.4 ng/dL (ref 0.60–1.60)

## 2021-11-16 LAB — T3, FREE: T3, Free: 4 pg/mL (ref 2.3–4.2)

## 2021-11-16 LAB — TSH: TSH: 0.02 u[IU]/mL — ABNORMAL LOW (ref 0.35–5.50)

## 2021-11-22 ENCOUNTER — Encounter: Payer: Self-pay | Admitting: Internal Medicine

## 2021-11-22 ENCOUNTER — Ambulatory Visit: Payer: 59 | Admitting: Internal Medicine

## 2021-11-22 ENCOUNTER — Other Ambulatory Visit: Payer: Self-pay

## 2021-11-22 VITALS — BP 120/76 | HR 86 | Ht 64.0 in | Wt 166.2 lb

## 2021-11-22 DIAGNOSIS — E049 Nontoxic goiter, unspecified: Secondary | ICD-10-CM | POA: Diagnosis not present

## 2021-11-22 DIAGNOSIS — E0500 Thyrotoxicosis with diffuse goiter without thyrotoxic crisis or storm: Secondary | ICD-10-CM

## 2021-11-22 NOTE — Progress Notes (Signed)
Patient ID: Kristina Powell, female   DOB: 11-Nov-1975, 46 y.o.   MRN: [id number]  This visit occurred during the SARS-CoV-2 public health emergency.  Safety protocols were in place, including screening questions prior to the visit, additional usage of staff PPE, and extensive cleaning of exam room while observing appropriate contact time as indicated for disinfecting solutions.   HPI  Kristina Powell is a 46 y.o.-year-old female, presenting for management of Graves ds. Last visit 1 year ago. Work Health Provider: Claude Manges, FNP, RD, LDN  Interim history: She continues to work from home and going in the office 1x a week. She describes having had increased stress in the last few months with her husband being sick, her taking 2 classes for her masters, and with her son preparing to go to college. He has hair loss, insomnia, some weight loss.  She started to feel better after we increase methimazole a week ago.  Reviewed history: Pt was dx with hyperthyroidism in 2005 >> developed this after she had her child >> saw PCP in [place], where she lived at the time >> dx with Graves ds. (unclear if she had an uptake and scan then, but likely) >> tx with MMI from 2012-2014 >> stopped 06/2013 >> we had to restart methimazole in 2015.  In the past, we were able to decrease her methimazole to 2.5 mg daily but that we had to increase it up to 10 mg twice a day.  09/2018: Methimazole dose decreased to 5 mg twice a day 08/2019: Methimazole dose decreased to 5 mg daily 11/15/2021: Methimazole dose increased to 5 mg twice a day  Reviewed her TFTs: Lab Results  Component Value Date   TSH 0.02 (L) 11/15/2021   TSH 0.21 (L) 05/21/2021   TSH 1.18 11/21/2020   TSH 2.49 04/26/2020   TSH 1.58 01/06/2020   TSH 4.62 (H) 09/28/2019   TSH 5.69 (H) 10/13/2018   TSH 2.870 08/14/2018   TSH <0.006 (L) 11/07/2017   TSH 0.30 (L) 05/08/2017   FREET4 1.40 11/15/2021   FREET4 1.22 05/21/2021   FREET4 1.08 11/21/2020   FREET4 0.92  01/06/2020   FREET4 0.83 09/28/2019   FREET4 0.83 10/13/2018   FREET4 1.18 08/14/2018   FREET4 4.71 (H) 11/07/2017   FREET4 1.16 05/08/2017   FREET4 0.97 01/22/2016  05/20/2018: TSH 3.58, free T4 1.1.   06/20/2016: TSH 0.77, Free T4 1.5, Free T3 2.9 03/22/2016: TSH 2.14, free T4 1.3 (0.8-1.8), free T3 2.8 (2.3-4.2) -all normal >> MMI dose decreased to 2.5 mg daily 02/16/2015: TSH 3.6, free T4 0.9, free T3 2.5 - decreased the methimazole from 10 mg 3 times a day to only 2 times a day. 11/24/2013: TSH 0.86, fT4 1.4, TSI 68 (<140%) 07/02/2012: TSH 3.18, TT4 9.4 (4.5-12) 04/14/2012: TSH 1.81  TSI's were previously elevated but improved: Lab Results  Component Value Date   TSI 122 11/21/2020   TSI 264 (H) 10/13/2018   TSI 305 (H) 05/08/2017   TSI 219 (H) 09/19/2014  11/24/2013: TSI 68 (<140%)  Pt denies: - feeling nodules in neck - hoarseness - dysphagia - choking - SOB with lying down  She sees ophthalmology (Dr. Burgess Estelle) >> last visit 2019.  She had blurry vision in the past, but this resolved before last visit. Feels left eye larger than the right.  She wears glasses when driving. She had pterygium sx in 03/2017.  Mother had hemithyroidectomy. Sister with thyroid dysfunction.  No family history of thyroid cancer.  ROS: +  see HPI I reviewed pt's medications, allergies, PMH, social hx, family hx, and changes were documented in the history of present illness. Otherwise, unchanged from my initial visit note.   Past Medical History:  Diagnosis Date   Allergy    Chicken pox    Gastritis    GERD (gastroesophageal reflux disease)    Hyperthyroidism    x2 years, post childbirth   Past Surgical History:  Procedure Laterality Date   bone graft dental  09/2018   COLONOSCOPY     1 polyp per pt " years ago"   EYE SURGERY Right 03/2016   UPPER GASTROINTESTINAL ENDOSCOPY     History   Social History   Marital Status: Married    Spouse Name: N/A    Number of Children: 1    Occupational History   Training and development officer   Social History Main Topics   Smoking status: Never Smoker    Smokeless tobacco: No   Alcohol Use: No   Drug Use: No   Social History Narrative   Current Outpatient Medications on File Prior to Visit  Medication Sig Dispense Refill   cholecalciferol (VITAMIN D) 1000 UNITS tablet Take 1,000 Units by mouth as directed. Three times a day     clobetasol cream (TEMOVATE) 0.05 % Apply topically 2 (two) times daily. 30 g 2   famotidine (PEPCID) 40 MG tablet Take 1 tablet (40 mg total) by mouth daily. 90 tablet 2   fluticasone (FLONASE) 50 MCG/ACT nasal spray Place 2 sprays into both nostrils daily. 16 g 6   levocetirizine (XYZAL) 5 MG tablet Take 1 tablet (5 mg total) by mouth every evening. 30 tablet 2   methimazole (TAPAZOLE) 5 MG tablet Take 2 tablets (10 mg total) by mouth 2 (two) times daily. 360 tablet 1   Current Facility-Administered Medications on File Prior to Visit  Medication Dose Route Frequency Provider Last Rate Last Admin   0.9 %  sodium chloride infusion  500 mL Intravenous Continuous Nandigam, Eleonore Chiquito, MD       No Known Allergies Family History  Problem Relation Age of Onset   Hyperlipidemia Mother    Thyroid disease Mother    Skin cancer Mother    Diabetes Maternal Grandmother    Healthy Father    Thyroid disease Sister    Colon cancer Neg Hx    Colon polyps Neg Hx    Esophageal cancer Neg Hx    Rectal cancer Neg Hx    Stomach cancer Neg Hx    PE: BP 120/76 (BP Location: Right Arm, Patient Position: Sitting, Cuff Size: Normal)    Pulse 86    Ht 5\' 4"  (1.626 m)    Wt 166 lb 3.2 oz (75.4 kg)    SpO2 99%    BMI 28.53 kg/m    Wt Readings from Last 3 Encounters:  11/22/21 166 lb 3.2 oz (75.4 kg)  11/21/20 171 lb 12.8 oz (77.9 kg)  01/25/20 170 lb 4 oz (77.2 kg)   Constitutional: Slightly overweight, in NAD Eyes: PERRLA, EOMI, no exophthalmos ENT: moist mucous membranes, no thyromegaly, no cervical  lymphadenopathy Cardiovascular: RRR, No MRG Respiratory: CTA B Musculoskeletal: no deformities, strength intact in all 4 Skin: moist, warm, no rashes Neurological: no tremor with outstretched hands, DTR normal in all 4  ASSESSMENT: 1. Graves disease  -No further plans for pregnancy.  Husband had a vasectomy.  2. Goiter  PLAN:  1. Patient with persistent/recurrent Graves' disease, managed on methimazole -She was on  a higher dose of methimazole before but we decreased the dose to 5 mg daily in 08/2019 -TFTs were normal afterwards, however, in 04/2021, a TSH was slightly low.  At that time, we communicated through my chart and she mentions that she was using a cream with clobetasol.  I advised her that steroids can artificially lower the TSH.  We did not change the methimazole dose at that time -Repeat TSH obtained few days ago returned suppressed so I did advise her to increase the dose of methimazole to twice a day. -At last visit, in 10/2020, TSI's improved to normal -We did discuss about potentially using RAI treatment, but she declined it in the last 2 years during the coronavirus pandemic.  We again discussed about it today and she is afraid of side effects.  I reassured her that hypothyroidism, it is the only consequence that she can expect.  We discussed that we usually treat this with levothyroxine.  I also mentioned that a smaller percentage of patients may maintain euthyroidism or remain hyperthyroid after RAI treatment.  For now, however, she would want to manage her disease without RAI treatment. -At today's visit she has no symptoms of thyrotoxicosis especially after increasing the methimazole dose: No palpitations, unintentional weight loss, heat intolerance, anxiety -At today's visit we discussed about rechecking her thyroid testing in 4 weeks -I we will see her back in a year  2. Goiter -Patient continues to have a slightly enlarged thyroid, predominantly the left lobe -No  neck compression symptoms -We will continue to follow her expectantly for now  Orders Placed This Encounter  Procedures   TSH   T4, free   T3, free   Carlus Pavlov, MD PhD [facility] Endocrinology

## 2021-11-22 NOTE — Patient Instructions (Addendum)
Please come back for labs in 4 weeks.  Continue methimazole 5 mg 2x a day.  Please come back for a follow-up appointment in 1 year.

## 2021-12-20 ENCOUNTER — Other Ambulatory Visit: Payer: 59

## 2021-12-27 ENCOUNTER — Other Ambulatory Visit (INDEPENDENT_AMBULATORY_CARE_PROVIDER_SITE_OTHER): Payer: 59

## 2021-12-27 DIAGNOSIS — E0500 Thyrotoxicosis with diffuse goiter without thyrotoxic crisis or storm: Secondary | ICD-10-CM

## 2021-12-28 LAB — T3, FREE: T3, Free: 3.7 pg/mL (ref 2.3–4.2)

## 2021-12-28 LAB — T4, FREE: Free T4: 1.12 ng/dL (ref 0.60–1.60)

## 2021-12-28 LAB — TSH: TSH: 0.09 u[IU]/mL — ABNORMAL LOW (ref 0.35–5.50)

## 2022-02-28 DIAGNOSIS — E7800 Pure hypercholesterolemia, unspecified: Secondary | ICD-10-CM | POA: Diagnosis not present

## 2022-02-28 DIAGNOSIS — Z79899 Other long term (current) drug therapy: Secondary | ICD-10-CM | POA: Diagnosis not present

## 2022-03-04 DIAGNOSIS — Z1211 Encounter for screening for malignant neoplasm of colon: Secondary | ICD-10-CM | POA: Diagnosis not present

## 2022-03-04 DIAGNOSIS — E7800 Pure hypercholesterolemia, unspecified: Secondary | ICD-10-CM | POA: Diagnosis not present

## 2022-03-04 DIAGNOSIS — Z79899 Other long term (current) drug therapy: Secondary | ICD-10-CM | POA: Diagnosis not present

## 2022-03-04 DIAGNOSIS — E0500 Thyrotoxicosis with diffuse goiter without thyrotoxic crisis or storm: Secondary | ICD-10-CM | POA: Diagnosis not present

## 2022-03-04 DIAGNOSIS — B353 Tinea pedis: Secondary | ICD-10-CM | POA: Diagnosis not present

## 2022-03-04 DIAGNOSIS — Z0000 Encounter for general adult medical examination without abnormal findings: Secondary | ICD-10-CM | POA: Diagnosis not present

## 2022-03-19 ENCOUNTER — Other Ambulatory Visit (INDEPENDENT_AMBULATORY_CARE_PROVIDER_SITE_OTHER): Payer: 59

## 2022-03-19 ENCOUNTER — Other Ambulatory Visit: Payer: Self-pay | Admitting: Family Medicine

## 2022-03-19 ENCOUNTER — Telehealth: Payer: Self-pay | Admitting: Internal Medicine

## 2022-03-19 DIAGNOSIS — Z1231 Encounter for screening mammogram for malignant neoplasm of breast: Secondary | ICD-10-CM

## 2022-03-19 DIAGNOSIS — E0500 Thyrotoxicosis with diffuse goiter without thyrotoxic crisis or storm: Secondary | ICD-10-CM | POA: Diagnosis not present

## 2022-03-19 LAB — TSH: TSH: 0.01 u[IU]/mL — ABNORMAL LOW (ref 0.35–5.50)

## 2022-03-19 LAB — T3, FREE: T3, Free: 3.9 pg/mL (ref 2.3–4.2)

## 2022-03-19 LAB — T4, FREE: Free T4: 1.41 ng/dL (ref 0.60–1.60)

## 2022-03-19 NOTE — Telephone Encounter (Signed)
Can you please make sure the pt is taking TWO tablets of methimazole TWO times daily ( total of 4 a day ) ?  Her thyroid continues to be overactive    Let me know so I can adjust her dose ?    Thanks

## 2022-03-19 NOTE — Telephone Encounter (Signed)
When should patient come back for labs

## 2022-03-19 NOTE — Telephone Encounter (Signed)
Patient states that she has just been taking 5mg  2x a day per her instructions from last visit.

## 2022-04-12 ENCOUNTER — Ambulatory Visit: Payer: 59 | Admitting: Physician Assistant

## 2022-04-15 ENCOUNTER — Ambulatory Visit: Payer: 59

## 2022-04-18 DIAGNOSIS — N6012 Diffuse cystic mastopathy of left breast: Secondary | ICD-10-CM | POA: Diagnosis not present

## 2022-06-05 ENCOUNTER — Other Ambulatory Visit: Payer: Self-pay

## 2022-06-05 DIAGNOSIS — E0500 Thyrotoxicosis with diffuse goiter without thyrotoxic crisis or storm: Secondary | ICD-10-CM

## 2022-06-05 MED ORDER — METHIMAZOLE 5 MG PO TABS: 10.0000 mg | ORAL_TABLET | Freq: Two times a day (BID) | ORAL | 1 refills | Status: DC

## 2022-06-12 DIAGNOSIS — K635 Polyp of colon: Secondary | ICD-10-CM | POA: Diagnosis not present

## 2022-06-12 DIAGNOSIS — Z1211 Encounter for screening for malignant neoplasm of colon: Secondary | ICD-10-CM | POA: Diagnosis not present

## 2022-06-12 DIAGNOSIS — K5730 Diverticulosis of large intestine without perforation or abscess without bleeding: Secondary | ICD-10-CM | POA: Diagnosis not present

## 2022-06-12 DIAGNOSIS — D125 Benign neoplasm of sigmoid colon: Secondary | ICD-10-CM | POA: Diagnosis not present

## 2022-06-28 ENCOUNTER — Telehealth: Payer: Self-pay

## 2022-06-28 DIAGNOSIS — E0500 Thyrotoxicosis with diffuse goiter without thyrotoxic crisis or storm: Secondary | ICD-10-CM

## 2022-06-28 NOTE — Telephone Encounter (Signed)
A, Yes, can you please order a TSH, free T4, free T3 and mail these to her? Ty! C

## 2022-06-28 NOTE — Telephone Encounter (Signed)
Patient LVM wanting to know if its okay that she has lab performed at her clinic and if so if the orders can be faxed to them.  Fax #: [phone]

## 2022-07-01 ENCOUNTER — Other Ambulatory Visit: Payer: Self-pay

## 2022-07-01 DIAGNOSIS — E0500 Thyrotoxicosis with diffuse goiter without thyrotoxic crisis or storm: Secondary | ICD-10-CM

## 2022-07-01 NOTE — Addendum Note (Signed)
Addended by: Sarina Ill on: 07/01/2022 09:02 AM   Modules accepted: Orders

## 2022-07-03 ENCOUNTER — Other Ambulatory Visit: Payer: Self-pay

## 2022-07-03 DIAGNOSIS — E0500 Thyrotoxicosis with diffuse goiter without thyrotoxic crisis or storm: Secondary | ICD-10-CM

## 2022-07-03 NOTE — Telephone Encounter (Signed)
Patient informed that we will be mailing the orders to her new address in [place].   Employer clinic: [place]

## 2022-07-03 NOTE — Telephone Encounter (Signed)
Order placed, printed and sent for outgoing mail.

## 2022-07-04 ENCOUNTER — Other Ambulatory Visit: Payer: Self-pay

## 2022-07-20 ENCOUNTER — Encounter: Payer: Self-pay | Admitting: Internal Medicine

## 2022-08-05 ENCOUNTER — Other Ambulatory Visit: Payer: Self-pay

## 2022-08-05 DIAGNOSIS — E0500 Thyrotoxicosis with diffuse goiter without thyrotoxic crisis or storm: Secondary | ICD-10-CM

## 2022-09-10 ENCOUNTER — Encounter: Payer: Self-pay | Admitting: Internal Medicine

## 2022-09-11 ENCOUNTER — Other Ambulatory Visit: Payer: Self-pay | Admitting: Internal Medicine

## 2022-09-11 DIAGNOSIS — E0500 Thyrotoxicosis with diffuse goiter without thyrotoxic crisis or storm: Secondary | ICD-10-CM

## 2022-09-11 MED ORDER — METHIMAZOLE 5 MG PO TABS: 5.0000 mg | ORAL_TABLET | Freq: Two times a day (BID) | ORAL | 1 refills | Status: DC

## 2022-09-25 DIAGNOSIS — L280 Lichen simplex chronicus: Secondary | ICD-10-CM | POA: Diagnosis not present

## 2022-09-25 DIAGNOSIS — L2089 Other atopic dermatitis: Secondary | ICD-10-CM | POA: Diagnosis not present

## 2022-09-25 DIAGNOSIS — L659 Nonscarring hair loss, unspecified: Secondary | ICD-10-CM | POA: Diagnosis not present

## 2022-11-22 ENCOUNTER — Ambulatory Visit: Payer: No Typology Code available for payment source | Admitting: Internal Medicine

## 2022-11-22 ENCOUNTER — Encounter: Payer: Self-pay | Admitting: Internal Medicine

## 2022-11-22 VITALS — BP 118/78 | HR 79 | Ht 64.0 in | Wt 158.0 lb

## 2022-11-22 DIAGNOSIS — E0500 Thyrotoxicosis with diffuse goiter without thyrotoxic crisis or storm: Secondary | ICD-10-CM | POA: Diagnosis not present

## 2022-11-22 DIAGNOSIS — E049 Nontoxic goiter, unspecified: Secondary | ICD-10-CM | POA: Diagnosis not present

## 2022-11-22 MED ORDER — METHIMAZOLE 5 MG PO TABS: 5.0000 mg | ORAL_TABLET | Freq: Every day | ORAL | 1 refills | Status: DC

## 2022-11-22 NOTE — Patient Instructions (Addendum)
Please decrease methimazole to 5 mg daily.  Let's repeat the labs in 1 month.  You should have an endocrinology follow-up appointment in 1 year.

## 2022-11-22 NOTE — Progress Notes (Signed)
Patient ID: Kristina Powell, female   DOB: 22-Apr-1976, 47 y.o.   MRN: [id number]  HPI  Kristina Powell is a 47 y.o.-year-old female, presenting for management of Graves ds. Last visit 1 year ago. Work Health Provider: Luanne Bras, FNP, RD, LDN  She now lives in [place] and commutes to here. She may move back after son finishes college ([employer]).  Interim history: She has hair loss, but otherwise no complaints today. She is exercising more.  She lost 8 pounds since last visit.  Reviewed history: Pt was dx with hyperthyroidism in 2005 >> developed this after she had her child >> saw PCP in [place], where she lived at the time >> dx with Graves ds. (unclear if she had an uptake and scan then, but likely) >> tx with MMI from 2012-2014 >> stopped 06/2013 >> we had to restart methimazole in 2015.  In the past, we were able to decrease her methimazole to 2.5 mg daily but that we had to increase it up to 10 mg twice a day.  09/2018: Methimazole dose decreased to 5 mg twice a day 08/2019: Methimazole dose decreased to 5 mg daily 10/2021: Methimazole dose increased to 5 mg 2x a day 02/2022: Methimazole dose increased to 15 mg daily 08/2022: Methimazole decreased to 5 mg 2x a day  Reviewed her TFTs: 11/16/2022: TSH 4.65 (0.4-4.5), fT4 1.1, fT3 3.0, normal 09/09/2022: TSH 7.24 07/12/2022: TSH 3.51, normal Lab Results  Component Value Date   TSH 0.01 (L) 03/19/2022   TSH 0.09 (L) 12/27/2021   TSH 0.02 (L) 11/15/2021   TSH 0.21 (L) 05/21/2021   TSH 1.18 11/21/2020   TSH 2.49 04/26/2020   TSH 1.58 01/06/2020   TSH 4.62 (H) 09/28/2019   TSH 5.69 (H) 10/13/2018   TSH 2.870 08/14/2018   FREET4 1.41 03/19/2022   FREET4 1.12 12/27/2021   FREET4 1.40 11/15/2021   FREET4 1.22 05/21/2021   FREET4 1.08 11/21/2020   FREET4 0.92 01/06/2020   FREET4 0.83 09/28/2019   FREET4 0.83 10/13/2018   FREET4 1.18 08/14/2018   FREET4 4.71 (H) 11/07/2017  05/20/2018: TSH 3.58, free T4 1.1.   06/20/2016: TSH 0.77, Free T4  1.5, Free T3 2.9 03/22/2016: TSH 2.14, free T4 1.3 (0.8-1.8), free T3 2.8 (2.3-4.2) -all normal >> MMI dose decreased to 2.5 mg daily 02/16/2015: TSH 3.6, free T4 0.9, free T3 2.5 - decreased the methimazole from 10 mg 3 times a day to only 2 times a day. 11/24/2013: TSH 0.86, fT4 1.4, TSI 68 (<140%) 07/02/2012: TSH 3.18, TT4 9.4 (4.5-12) 04/14/2012: TSH 1.81  TSI's were previously elevated but improved: Lab Results  Component Value Date   TSI 122 11/21/2020   TSI 264 (H) 10/13/2018   TSI 305 (H) 05/08/2017   TSI 219 (H) 09/19/2014  11/24/2013: TSI 68 (<140%)  Pt denies: - feeling nodules in neck - hoarseness - dysphagia - choking  She sees ophthalmology (Dr. Satira Sark) >> last visit 2019.  She had blurry vision in the past, but this resolved. Feels left eye larger than the right.  She wears glasses when driving. She had pterygium sx in 03/2017.  Mother had hemithyroidectomy. Sister with thyroid dysfunction.  No family history of thyroid cancer.  No further plans for pregnancy.  Husband had a vasectomy.  ROS: + see HPI I reviewed pt's medications, allergies, PMH, social hx, family hx, and changes were documented in the history of present illness. Otherwise, unchanged from my initial visit note.  Past Medical History:  Diagnosis  Date   Allergy    Chicken pox    Gastritis    GERD (gastroesophageal reflux disease)    Hyperthyroidism    x2 years, post childbirth   Past Surgical History:  Procedure Laterality Date   bone graft dental  09/2018   COLONOSCOPY     1 polyp per pt " years ago"   EYE SURGERY Right 03/2016   UPPER GASTROINTESTINAL ENDOSCOPY     History   Social History   Marital Status: Married    Spouse Name: N/A    Number of Children: 1   Occupational History   Occupational psychologist   Social History Main Topics   Smoking status: Never Smoker    Smokeless tobacco: No   Alcohol Use: No   Drug Use: No   Social History Narrative   Current Outpatient  Medications on File Prior to Visit  Medication Sig Dispense Refill   cholecalciferol (VITAMIN D) 1000 UNITS tablet Take 1,000 Units by mouth as directed. Three times a day     clobetasol cream (TEMOVATE) 0.05 % Apply topically 2 (two) times daily. (Patient not taking: Reported on 11/22/2021) 30 g 2   famotidine (PEPCID) 40 MG tablet Take 1 tablet (40 mg total) by mouth daily. (Patient not taking: Reported on 11/22/2021) 90 tablet 2   fluticasone (FLONASE) 50 MCG/ACT nasal spray Place 2 sprays into both nostrils daily. (Patient not taking: Reported on 11/22/2021) 16 g 6   levocetirizine (XYZAL) 5 MG tablet Take 1 tablet (5 mg total) by mouth every evening. (Patient not taking: Reported on 11/22/2021) 30 tablet 2   methimazole (TAPAZOLE) 5 MG tablet Take 1 tablet (5 mg total) by mouth 2 (two) times daily. 360 tablet 1   Current Facility-Administered Medications on File Prior to Visit  Medication Dose Route Frequency Provider Last Rate Last Admin   0.9 %  sodium chloride infusion  500 mL Intravenous Continuous Nandigam, Venia Minks, MD       No Known Allergies Family History  Problem Relation Age of Onset   Hyperlipidemia Mother    Thyroid disease Mother    Skin cancer Mother    Diabetes Maternal Grandmother    Healthy Father    Thyroid disease Sister    Colon cancer Neg Hx    Colon polyps Neg Hx    Esophageal cancer Neg Hx    Rectal cancer Neg Hx    Stomach cancer Neg Hx    PE: BP 118/78 (BP Location: Right Arm, Patient Position: Sitting, Cuff Size: Normal)   Pulse 79   Ht '5\' 4"'$  (1.626 m)   Wt 158 lb (71.7 kg)   SpO2 99%   BMI 27.12 kg/m    Wt Readings from Last 3 Encounters:  11/22/22 158 lb (71.7 kg)  11/22/21 166 lb 3.2 oz (75.4 kg)  11/21/20 171 lb 12.8 oz (77.9 kg)   Constitutional: overweight, in NAD Eyes:  EOMI, no exophthalmos ENT: no neck masses, no cervical lymphadenopathy Cardiovascular: RRR, No MRG Respiratory: CTA B Musculoskeletal: no deformities Skin:no  rashes Neurological: no tremor with outstretched hands  ASSESSMENT: 1. Graves disease  2. Goiter  PLAN:  1. Patient with persistent/recurrent Graves' disease, managed on methimazole. -She was on a higher dose of methimazole before but we decreased the dose to 5 mg daily in 08/2019.  However, afterwards, we had to increase the methimazole to 15 mg daily, but the TSH was higher in the normal range in 08/2022, so we increased the dose to 5  mg twice a day.  She continues on this dose now. -At last check, her TSIs improved to normal -We did discuss about potentially using RAI treatment, but she declines during the coronavirus pandemic, and after subsequent discussions, she admitted that she was afraid of side effects.  I advised her that hypothyroidism is the only consequence that we cannot expect.  We discussed that we usually treat this with levothyroxine.  I also mentioned that a small percentage of patients may maintain euthyroidism or remain hypothyroid after treatment.  For now, however, she would want to manage her disease without RAI treatment -She has no symptoms of thyrotoxicosis: No palpitations, unintentional weight loss, heat intolerance, anxiety. She did lose some weight, intentionally, through increased exercise.  She also has hair loss, which is chronic. -At today's visit, she brings her labs checked last week.  A TSH was slightly above the upper limit of the target range, while free T4 and free T3 were normal -We will reduce the dose of methimazole to 5 mg daily and recheck the tests in 1 month -I will see her back in a year (virtual appointment, since she now lives out of town) -we did discuss that I would place a referral for another endocrinologist in the [place] area if she decides to do that.  She will let me know.  2. Goiter -Patient continues to have a slightly enlarged thyroid, predominantly the left lobe -No neck compression symptoms -We are following expectantly for  now  Orders Placed This Encounter  Procedures   TSH   T4, free   T3, free   Philemon Kingdom, MD PhD [facility] Endocrinology

## 2022-12-05 DIAGNOSIS — L280 Lichen simplex chronicus: Secondary | ICD-10-CM | POA: Diagnosis not present

## 2022-12-05 DIAGNOSIS — L2089 Other atopic dermatitis: Secondary | ICD-10-CM | POA: Diagnosis not present

## 2023-03-10 DIAGNOSIS — E7800 Pure hypercholesterolemia, unspecified: Secondary | ICD-10-CM | POA: Diagnosis not present

## 2023-03-10 DIAGNOSIS — Z79899 Other long term (current) drug therapy: Secondary | ICD-10-CM | POA: Diagnosis not present

## 2023-03-10 DIAGNOSIS — Z0000 Encounter for general adult medical examination without abnormal findings: Secondary | ICD-10-CM | POA: Diagnosis not present

## 2023-03-10 DIAGNOSIS — E0500 Thyrotoxicosis with diffuse goiter without thyrotoxic crisis or storm: Secondary | ICD-10-CM | POA: Diagnosis not present

## 2023-08-15 DIAGNOSIS — Z1231 Encounter for screening mammogram for malignant neoplasm of breast: Secondary | ICD-10-CM | POA: Diagnosis not present

## 2023-08-22 DIAGNOSIS — B001 Herpesviral vesicular dermatitis: Secondary | ICD-10-CM | POA: Diagnosis not present

## 2023-09-08 ENCOUNTER — Telehealth: Payer: Self-pay

## 2023-09-08 ENCOUNTER — Encounter: Payer: Self-pay | Admitting: Internal Medicine

## 2023-09-08 DIAGNOSIS — E0500 Thyrotoxicosis with diffuse goiter without thyrotoxic crisis or storm: Secondary | ICD-10-CM

## 2023-09-08 NOTE — Telephone Encounter (Signed)
 Orders Placed This Encounter  Procedures   T3, free   T4, free   TSH

## 2023-09-26 DIAGNOSIS — E0500 Thyrotoxicosis with diffuse goiter without thyrotoxic crisis or storm: Secondary | ICD-10-CM | POA: Diagnosis not present

## 2023-09-27 LAB — TSH: TSH: 2.12 m[IU]/L

## 2023-09-27 LAB — T4, FREE: Free T4: 1.2 ng/dL (ref 0.8–1.8)

## 2023-09-27 LAB — T3, FREE: T3, Free: 3.2 pg/mL (ref 2.3–4.2)

## 2023-10-10 ENCOUNTER — Other Ambulatory Visit: Payer: Self-pay | Admitting: Internal Medicine

## 2023-10-10 DIAGNOSIS — M25562 Pain in left knee: Secondary | ICD-10-CM | POA: Diagnosis not present

## 2023-10-10 DIAGNOSIS — G8929 Other chronic pain: Secondary | ICD-10-CM | POA: Diagnosis not present

## 2023-10-10 DIAGNOSIS — E0500 Thyrotoxicosis with diffuse goiter without thyrotoxic crisis or storm: Secondary | ICD-10-CM

## 2023-11-14 DIAGNOSIS — G8929 Other chronic pain: Secondary | ICD-10-CM | POA: Diagnosis not present

## 2023-11-14 DIAGNOSIS — M25562 Pain in left knee: Secondary | ICD-10-CM | POA: Diagnosis not present

## 2023-11-14 DIAGNOSIS — M6281 Muscle weakness (generalized): Secondary | ICD-10-CM | POA: Diagnosis not present

## 2023-11-14 DIAGNOSIS — M25662 Stiffness of left knee, not elsewhere classified: Secondary | ICD-10-CM | POA: Diagnosis not present

## 2023-11-18 DIAGNOSIS — M25662 Stiffness of left knee, not elsewhere classified: Secondary | ICD-10-CM | POA: Diagnosis not present

## 2023-11-18 DIAGNOSIS — G8929 Other chronic pain: Secondary | ICD-10-CM | POA: Diagnosis not present

## 2023-11-18 DIAGNOSIS — M6281 Muscle weakness (generalized): Secondary | ICD-10-CM | POA: Diagnosis not present

## 2023-11-18 DIAGNOSIS — M25562 Pain in left knee: Secondary | ICD-10-CM | POA: Diagnosis not present

## 2023-11-25 ENCOUNTER — Telehealth (INDEPENDENT_AMBULATORY_CARE_PROVIDER_SITE_OTHER): Payer: No Typology Code available for payment source | Admitting: Internal Medicine

## 2023-11-25 ENCOUNTER — Encounter: Payer: Self-pay | Admitting: Internal Medicine

## 2023-11-25 DIAGNOSIS — E049 Nontoxic goiter, unspecified: Secondary | ICD-10-CM

## 2023-11-25 DIAGNOSIS — E0500 Thyrotoxicosis with diffuse goiter without thyrotoxic crisis or storm: Secondary | ICD-10-CM | POA: Diagnosis not present

## 2023-11-25 MED ORDER — METHIMAZOLE 5 MG PO TABS: 5.0000 mg | ORAL_TABLET | Freq: Every day | ORAL | 3 refills | Status: AC

## 2023-11-25 NOTE — Progress Notes (Signed)
 Patient ID: Kristina Powell, female   DOB: 06-12-1976, 48 y.o.   MRN: [id number]  Patient location: Office, [facility] My location: Office Persons participating in the virtual visit: patient, provider  Referring Provider: Shon Hale, MD  I connected with the patient on 11/25/23 at  2:48 PM EST by a video enabled telemedicine application and verified that I am speaking with the correct person.   I discussed the limitations of evaluation and management by telemedicine and the availability of in person appointments. The patient expressed understanding and agreed to proceed.   Details of the encounter are shown below.  HPI  Kristina Powell is a 48 y.o.-year-old female, presenting for management of Graves ds. Last visit 1 year ago. Work Health Provider: Claude Manges, FNP, RD, LDN  She now lives in [place] and commutes to here. She may move back after son finishes college ([place]).  Interim history: She continues to have hair loss, but otherwise no complaints today. She lost 8 pounds before last visit through increased exercise. She lost 12 lbs since then. Now has knee pain >> less exercise. She is doing PT for the pain.  Reviewed history: Pt was dx with hyperthyroidism in 2005 >> developed this after she had her child >> saw PCP in [place] where [place] lived at the time >> dx with Graves ds. (unclear if she had an uptake and scan then, but likely) >> tx with MMI from 2012-2014 >> stopped 06/2013 >> we had to restart methimazole in 2015.  In the past, we were able to decrease her methimazole to 2.5 mg daily but that we had to increase it up to 10 mg twice a day.  09/2018: Methimazole dose decreased to 5 mg twice a day 08/2019: Methimazole dose decreased to 5 mg daily 10/2021: Methimazole dose increased to 5 mg 2x a day 02/2022: Methimazole dose increased to 15 mg daily 08/2022: Methimazole decreased to 5 mg 2x a day 10/2022: Methimazole decreased to 5 mg daily  Reviewed her TFTs: 11/18/2023: TSH  2.32 Lab Results  Component Value Date   TSH 2.12 09/26/2023   TSH 0.01 (L) 03/19/2022   TSH 0.09 (L) 12/27/2021   TSH 0.02 (L) 11/15/2021   TSH 0.21 (L) 05/21/2021   TSH 1.18 11/21/2020   TSH 2.49 04/26/2020   TSH 1.58 01/06/2020   TSH 4.62 (H) 09/28/2019   TSH 5.69 (H) 10/13/2018   FREET4 1.2 09/26/2023   FREET4 1.41 03/19/2022   FREET4 1.12 12/27/2021   FREET4 1.40 11/15/2021   FREET4 1.22 05/21/2021   FREET4 1.08 11/21/2020   FREET4 0.92 01/06/2020   FREET4 0.83 09/28/2019   FREET4 0.83 10/13/2018   FREET4 1.18 08/14/2018  11/16/2022: TSH 4.65 (0.4-4.5), fT4 1.1, fT3 3.0, normal 09/09/2022: TSH 7.24 07/12/2022: TSH 3.51, normal 05/20/2018: TSH 3.58, free T4 1.1.   06/20/2016: TSH 0.77, Free T4 1.5, Free T3 2.9 03/22/2016: TSH 2.14, free T4 1.3 (0.8-1.8), free T3 2.8 (2.3-4.2) -all normal >> MMI dose decreased to 2.5 mg daily 02/16/2015: TSH 3.6, free T4 0.9, free T3 2.5 - decreased the methimazole from 10 mg 3 times a day to only 2 times a day. 11/24/2013: TSH 0.86, fT4 1.4, TSI 68 (<140%) 07/02/2012: TSH 3.18, TT4 9.4 (4.5-12) 04/14/2012: TSH 1.81  TSI's were previously elevated but improved: Lab Results  Component Value Date   TSI 122 11/21/2020   TSI 264 (H) 10/13/2018   TSI 305 (H) 05/08/2017   TSI 219 (H) 09/19/2014  11/24/2013: TSI 68 (<140%)  Pt denies: - feeling nodules in neck - hoarseness - dysphagia - choking  She sees ophthalmology (Dr. Burgess Estelle) >> last visit 2019.  She had blurry vision in the past, but this resolved. Feels left eye larger than the right.  She wears glasses when driving. She had pterygium sx in 03/2017.  Mother had hemithyroidectomy. Sister with thyroid dysfunction.  No family history of thyroid cancer.  No further plans for pregnancy.  Husband had a vasectomy.  ROS: + see HPI I reviewed pt's medications, allergies, PMH, social hx, family hx, and changes were documented in the history of present illness. Otherwise, unchanged  from my initial visit note.  Past Medical History:  Diagnosis Date   Allergy    Chicken pox    Gastritis    GERD (gastroesophageal reflux disease)    Hyperthyroidism    x2 years, post childbirth   Past Surgical History:  Procedure Laterality Date   bone graft dental  09/2018   COLONOSCOPY     1 polyp per pt " years ago"   EYE SURGERY Right 03/2016   UPPER GASTROINTESTINAL ENDOSCOPY     History   Social History   Marital Status: Married    Spouse Name: N/A    Number of Children: 1   Occupational History   Training and development officer   Social History Main Topics   Smoking status: Never Smoker    Smokeless tobacco: No   Alcohol Use: No   Drug Use: No   Social History Narrative   Current Outpatient Medications on File Prior to Visit  Medication Sig Dispense Refill   cholecalciferol (VITAMIN D) 1000 UNITS tablet Take 1,000 Units by mouth as directed. Three times a day     clobetasol cream (TEMOVATE) 0.05 % Apply topically 2 (two) times daily. (Patient not taking: Reported on 11/22/2021) 30 g 2   famotidine (PEPCID) 40 MG tablet Take 1 tablet (40 mg total) by mouth daily. (Patient not taking: Reported on 11/22/2021) 90 tablet 2   fluticasone (FLONASE) 50 MCG/ACT nasal spray Place 2 sprays into both nostrils daily. (Patient not taking: Reported on 11/22/2021) 16 g 6   levocetirizine (XYZAL) 5 MG tablet Take 1 tablet (5 mg total) by mouth every evening. (Patient not taking: Reported on 11/22/2021) 30 tablet 2   methimazole (TAPAZOLE) 5 MG tablet Take 1 tablet (5 mg total) by mouth daily. 90 tablet 0   Current Facility-Administered Medications on File Prior to Visit  Medication Dose Route Frequency Provider Last Rate Last Admin   0.9 %  sodium chloride infusion  500 mL Intravenous Continuous Nandigam, Eleonore Chiquito, MD       No Known Allergies Family History  Problem Relation Age of Onset   Hyperlipidemia Mother    Thyroid disease Mother    Skin cancer Mother    Diabetes Maternal  Grandmother    Healthy Father    Thyroid disease Sister    Colon cancer Neg Hx    Colon polyps Neg Hx    Esophageal cancer Neg Hx    Rectal cancer Neg Hx    Stomach cancer Neg Hx    PE: There were no vitals taken for this visit.   Wt Readings from Last 3 Encounters:  11/22/22 158 lb (71.7 kg)  11/22/21 166 lb 3.2 oz (75.4 kg)  11/21/20 171 lb 12.8 oz (77.9 kg)   Constitutional:  in NAD  The physical exam was not performed (virtual visit).  ASSESSMENT: 1. Graves disease  2. Goiter  PLAN:  1. Patient with persistent/recurrent Graves' disease, managed on methimazole -Of note, her TSI's were elevated but normalized at last check -We previously discussed about using RAI treatment but she declined during the coronavirus pandemic and after subsequent discussions, she mentioned that she was afraid of side effects.  She continues to want to manage her hyperthyroidism without RAI treatment despite a discussion about benefits of RAI treatment. -She was previously on a higher dose of methimazole but we were able to decrease the dose to 5 mg daily in 08/2019.  However, afterwards, we had to increase the methimazole to 15 mg daily and decrease to 5 mg twice a day in 08/2022.  At last visit, in 10/2022, we decreased the dose to 5 mg daily -as her TSH was at the upper limit of the target range. -At today's visit, she has no palpitations, unintentional weight loss, heat intolerance, anxiety.  Before last visit she lost 8 pounds intentionally through increased exercise.  At today's visit she tells me that she lost approximately 12 more pounds since then.  However, she cannot exercise as much as before due to knee pain.  She is still exercising, though. -She had labs a week ago and the TSH was at goal.  For now, we are going to continue the methimazole at 5 mg daily, and I refilled this for her.  We discussed about backing off the methimazole or even stopping when the TSH increases in the 3-5 range.   She agrees with this plan. -She now lives out of town so we are continuing with virtual appointments until she can establish care with endocrinology in [place] area. -I plan to repeat her TFTs in 6 months and schedule another visit in 1 year.  2. Goiter -She continues to have a slightly enlarged thyroid, predominantly the left lobe -No neck compression symptoms -We are following her expectantly for now  Orders Placed This Encounter  Procedures   TSH   T4, free   T3, free   Carlus Pavlov, MD PhD [facility] Endocrinology

## 2023-11-25 NOTE — Patient Instructions (Signed)
 Please continue methimazole 5 mg daily.  Let's taken another set of thyroid tests whenever convenient.  You should have an endocrinology follow-up appointment in 1 year.

## 2023-12-05 DIAGNOSIS — M6281 Muscle weakness (generalized): Secondary | ICD-10-CM | POA: Diagnosis not present

## 2023-12-05 DIAGNOSIS — M25562 Pain in left knee: Secondary | ICD-10-CM | POA: Diagnosis not present

## 2023-12-05 DIAGNOSIS — M25662 Stiffness of left knee, not elsewhere classified: Secondary | ICD-10-CM | POA: Diagnosis not present

## 2023-12-05 DIAGNOSIS — G8929 Other chronic pain: Secondary | ICD-10-CM | POA: Diagnosis not present

## 2024-01-02 DIAGNOSIS — G8929 Other chronic pain: Secondary | ICD-10-CM | POA: Diagnosis not present

## 2024-01-02 DIAGNOSIS — M25662 Stiffness of left knee, not elsewhere classified: Secondary | ICD-10-CM | POA: Diagnosis not present

## 2024-01-02 DIAGNOSIS — M25562 Pain in left knee: Secondary | ICD-10-CM | POA: Diagnosis not present

## 2024-01-02 DIAGNOSIS — M6281 Muscle weakness (generalized): Secondary | ICD-10-CM | POA: Diagnosis not present

## 2024-01-13 DIAGNOSIS — G8929 Other chronic pain: Secondary | ICD-10-CM | POA: Diagnosis not present

## 2024-01-13 DIAGNOSIS — M25562 Pain in left knee: Secondary | ICD-10-CM | POA: Diagnosis not present

## 2024-01-13 DIAGNOSIS — M6281 Muscle weakness (generalized): Secondary | ICD-10-CM | POA: Diagnosis not present

## 2024-01-13 DIAGNOSIS — M25662 Stiffness of left knee, not elsewhere classified: Secondary | ICD-10-CM | POA: Diagnosis not present

## 2024-03-09 DIAGNOSIS — N390 Urinary tract infection, site not specified: Secondary | ICD-10-CM | POA: Diagnosis not present

## 2024-03-09 DIAGNOSIS — L309 Dermatitis, unspecified: Secondary | ICD-10-CM | POA: Diagnosis not present

## 2024-03-09 DIAGNOSIS — Z23 Encounter for immunization: Secondary | ICD-10-CM | POA: Diagnosis not present

## 2024-03-09 DIAGNOSIS — Z0000 Encounter for general adult medical examination without abnormal findings: Secondary | ICD-10-CM | POA: Diagnosis not present

## 2024-03-09 DIAGNOSIS — E7800 Pure hypercholesterolemia, unspecified: Secondary | ICD-10-CM | POA: Diagnosis not present

## 2024-03-09 DIAGNOSIS — E0500 Thyrotoxicosis with diffuse goiter without thyrotoxic crisis or storm: Secondary | ICD-10-CM | POA: Diagnosis not present

## 2024-03-09 DIAGNOSIS — Z79899 Other long term (current) drug therapy: Secondary | ICD-10-CM | POA: Diagnosis not present

## 2024-03-09 DIAGNOSIS — L900 Lichen sclerosus et atrophicus: Secondary | ICD-10-CM | POA: Diagnosis not present

## 2024-04-27 DIAGNOSIS — R051 Acute cough: Secondary | ICD-10-CM | POA: Diagnosis not present

## 2024-04-27 DIAGNOSIS — U071 COVID-19: Secondary | ICD-10-CM | POA: Diagnosis not present

## 2024-04-27 DIAGNOSIS — H6691 Otitis media, unspecified, right ear: Secondary | ICD-10-CM | POA: Diagnosis not present

## 2024-04-27 DIAGNOSIS — J029 Acute pharyngitis, unspecified: Secondary | ICD-10-CM | POA: Diagnosis not present

## 2024-05-03 DIAGNOSIS — R5383 Other fatigue: Secondary | ICD-10-CM | POA: Diagnosis not present

## 2024-05-03 DIAGNOSIS — U071 COVID-19: Secondary | ICD-10-CM | POA: Diagnosis not present

## 2024-07-02 DIAGNOSIS — E0500 Thyrotoxicosis with diffuse goiter without thyrotoxic crisis or storm: Secondary | ICD-10-CM | POA: Diagnosis not present

## 2024-07-03 LAB — T4, FREE: Free T4: 1.3 ng/dL (ref 0.8–1.8)

## 2024-07-03 LAB — T3, FREE: T3, Free: 3 pg/mL (ref 2.3–4.2)

## 2024-07-03 LAB — TSH: TSH: 2.38 m[IU]/L

## 2024-07-05 ENCOUNTER — Ambulatory Visit: Payer: Self-pay | Admitting: Internal Medicine

## 2024-07-08 DIAGNOSIS — J302 Other seasonal allergic rhinitis: Secondary | ICD-10-CM | POA: Diagnosis not present

## 2024-07-08 DIAGNOSIS — N926 Irregular menstruation, unspecified: Secondary | ICD-10-CM | POA: Diagnosis not present

## 2024-07-12 DIAGNOSIS — N939 Abnormal uterine and vaginal bleeding, unspecified: Secondary | ICD-10-CM | POA: Diagnosis not present

## 2024-07-12 DIAGNOSIS — Z124 Encounter for screening for malignant neoplasm of cervix: Secondary | ICD-10-CM | POA: Diagnosis not present

## 2024-07-12 DIAGNOSIS — Z01419 Encounter for gynecological examination (general) (routine) without abnormal findings: Secondary | ICD-10-CM | POA: Diagnosis not present

## 2024-07-12 DIAGNOSIS — Z1231 Encounter for screening mammogram for malignant neoplasm of breast: Secondary | ICD-10-CM | POA: Diagnosis not present

## 2024-07-14 DIAGNOSIS — L03012 Cellulitis of left finger: Secondary | ICD-10-CM | POA: Diagnosis not present

## 2024-07-15 DIAGNOSIS — Z1231 Encounter for screening mammogram for malignant neoplasm of breast: Secondary | ICD-10-CM | POA: Diagnosis not present

## 2024-07-15 DIAGNOSIS — R92333 Mammographic heterogeneous density, bilateral breasts: Secondary | ICD-10-CM | POA: Diagnosis not present

## 2024-07-26 DIAGNOSIS — N939 Abnormal uterine and vaginal bleeding, unspecified: Secondary | ICD-10-CM | POA: Diagnosis not present

## 2024-08-04 DIAGNOSIS — E669 Obesity, unspecified: Secondary | ICD-10-CM | POA: Diagnosis not present

## 2024-08-05 DIAGNOSIS — E669 Obesity, unspecified: Secondary | ICD-10-CM | POA: Diagnosis not present

## 2024-08-18 DIAGNOSIS — N951 Menopausal and female climacteric states: Secondary | ICD-10-CM | POA: Diagnosis not present

## 2024-09-01 DIAGNOSIS — Z1331 Encounter for screening for depression: Secondary | ICD-10-CM | POA: Diagnosis not present

## 2024-09-01 DIAGNOSIS — G479 Sleep disorder, unspecified: Secondary | ICD-10-CM | POA: Diagnosis not present

## 2024-09-01 DIAGNOSIS — N951 Menopausal and female climacteric states: Secondary | ICD-10-CM | POA: Diagnosis not present

## 2024-09-01 DIAGNOSIS — Z1321 Encounter for screening for nutritional disorder: Secondary | ICD-10-CM | POA: Diagnosis not present

## 2024-09-01 DIAGNOSIS — R5383 Other fatigue: Secondary | ICD-10-CM | POA: Diagnosis not present

## 2024-09-04 DIAGNOSIS — E669 Obesity, unspecified: Secondary | ICD-10-CM | POA: Diagnosis not present

## 2024-09-20 DIAGNOSIS — J040 Acute laryngitis: Secondary | ICD-10-CM | POA: Diagnosis not present

## 2024-09-20 DIAGNOSIS — R042 Hemoptysis: Secondary | ICD-10-CM | POA: Diagnosis not present

## 2024-09-20 DIAGNOSIS — J159 Unspecified bacterial pneumonia: Secondary | ICD-10-CM | POA: Diagnosis not present

## 2024-09-20 DIAGNOSIS — R051 Acute cough: Secondary | ICD-10-CM | POA: Diagnosis not present

## 2024-11-05 ENCOUNTER — Other Ambulatory Visit

## 2024-11-05 ENCOUNTER — Ambulatory Visit: Admitting: Internal Medicine

## 2024-11-05 ENCOUNTER — Encounter: Payer: Self-pay | Admitting: Internal Medicine

## 2024-11-05 VITALS — BP 122/70 | HR 78 | Ht 64.0 in | Wt 158.6 lb

## 2024-11-05 DIAGNOSIS — E0500 Thyrotoxicosis with diffuse goiter without thyrotoxic crisis or storm: Secondary | ICD-10-CM

## 2024-11-05 DIAGNOSIS — E049 Nontoxic goiter, unspecified: Secondary | ICD-10-CM

## 2024-11-05 NOTE — Patient Instructions (Signed)
 Please continue methimazole  5 mg daily.  You should have an endocrinology follow-up appointment in 1 year.

## 2024-11-05 NOTE — Progress Notes (Signed)
 Patient ID: Kristina Powell, female   DOB: August 10, 1976, 49 y.o.   MRN: [id number]  HPI  Kristina Powell is a 49 y.o.-year-old female, presenting for management of Graves ds. Last visit 1 year ago.  She now lives in [place]. She may move back after son finishes college ([place]).  He is a holiday representative.  Interim history: She previously approximately 20 lbs through increased exercise.  At last visit she had knee pain >> less exercise.  She is now also less active due to the winter weather.  Weight is net stable in the last 2 years. She recently started HRT - Estrogen patch, Progesterone. She is feeling well at today's visit, without complaints.  Reviewed history: Pt was dx with hyperthyroidism in 2005 >> developed this after she had her child >> saw PCP in [place], where she lived at the time >> dx with Graves ds. (unclear if she had an uptake and scan then, but likely) >> tx with MMI from 2012-2014 >> stopped 06/2013 >> we had to restart methimazole  in 2015.  In the past, we were able to decrease her methimazole  to 2.5 mg daily but that we had to increase it up to 10 mg twice a day.  09/2018: Methimazole  dose decreased to 5 mg twice a day 08/2019: Methimazole  dose decreased to 5 mg daily 10/2021: Methimazole  dose increased to 5 mg 2x a day 02/2022: Methimazole  dose increased to 15 mg daily 08/2022: Methimazole  decreased to 5 mg 2x a day 10/2022: Methimazole  decreased to 5 mg daily  Reviewed her TFTs: 09/01/2024: TSH 0.70 Lab Results  Component Value Date   TSH 2.38 07/02/2024   TSH 2.12 09/26/2023   TSH 0.01 (L) 03/19/2022   TSH 0.09 (L) 12/27/2021   TSH 0.02 (L) 11/15/2021   TSH 0.21 (L) 05/21/2021   TSH 1.18 11/21/2020   TSH 2.49 04/26/2020   TSH 1.58 01/06/2020   TSH 4.62 (H) 09/28/2019   FREET4 1.3 07/02/2024   FREET4 1.2 09/26/2023   FREET4 1.41 03/19/2022   FREET4 1.12 12/27/2021   FREET4 1.40 11/15/2021   FREET4 1.22 05/21/2021   FREET4 1.08 11/21/2020   FREET4 0.92 01/06/2020   FREET4 0.83  09/28/2019   FREET4 0.83 10/13/2018  11/18/2023: TSH 2.32 11/16/2022: TSH 4.65 (0.4-4.5), fT4 1.1, fT3 3.0, normal 09/09/2022: TSH 7.24 07/12/2022: TSH 3.51, normal 05/20/2018: TSH 3.58, free T4 1.1.   06/20/2016: TSH 0.77, Free T4 1.5, Free T3 2.9 03/22/2016: TSH 2.14, free T4 1.3 (0.8-1.8), free T3 2.8 (2.3-4.2) -all normal >> MMI dose decreased to 2.5 mg daily 02/16/2015: TSH 3.6, free T4 0.9, free T3 2.5 - decreased the methimazole  from 10 mg 3 times a day to only 2 times a day. 11/24/2013: TSH 0.86, fT4 1.4, TSI 68 (<140%) 07/02/2012: TSH 3.18, TT4 9.4 (4.5-12) 04/14/2012: TSH 1.81  TSI's were previously elevated but improved: Lab Results  Component Value Date   TSI 122 11/21/2020   TSI 264 (H) 10/13/2018   TSI 305 (H) 05/08/2017   TSI 219 (H) 09/19/2014  11/24/2013: TSI 68 (<140%)  Pt denies: - feeling nodules in neck - hoarseness - dysphagia - choking  She sees ophthalmology (Dr. Patrcia) >> last visit 2019.  She had blurry vision in the past, but this resolved. Feels left eye larger than the right.  She wears glasses when driving. She had pterygium sx in 03/2017.  Mother had hemithyroidectomy. Sister with thyroid  dysfunction.  No family history of thyroid  cancer.  ROS: + see HPI I reviewed pt's  medications, allergies, PMH, social hx, family hx, and changes were documented in the history of present illness. Otherwise, unchanged from my initial visit note.  Past Medical History:  Diagnosis Date   Allergy    Chicken pox    Gastritis    GERD (gastroesophageal reflux disease)    Hyperthyroidism    x2 years, post childbirth   Past Surgical History:  Procedure Laterality Date   bone graft dental  09/2018   COLONOSCOPY     1 polyp per pt  years ago   EYE SURGERY Right 03/2016   UPPER GASTROINTESTINAL ENDOSCOPY     History   Social History   Marital Status: Married    Spouse Name: N/A    Number of Children: 1   Occupational History   Training and development officer    Social History Main Topics   Smoking status: Never Smoker    Smokeless tobacco: No   Alcohol Use: No   Drug Use: No   Social History Narrative   Current Outpatient Medications on File Prior to Visit  Medication Sig Dispense Refill   cholecalciferol (VITAMIN D) 1000 UNITS tablet Take 1,000 Units by mouth as directed. Three times a day (Patient not taking: Reported on 11/25/2023)     clobetasol  cream (TEMOVATE ) 0.05 % Apply topically 2 (two) times daily. (Patient not taking: Reported on 11/25/2023) 30 g 2   famotidine  (PEPCID ) 40 MG tablet Take 1 tablet (40 mg total) by mouth daily. (Patient not taking: Reported on 11/25/2023) 90 tablet 2   fluticasone  (FLONASE ) 50 MCG/ACT nasal spray Place 2 sprays into both nostrils daily. (Patient not taking: Reported on 11/25/2023) 16 g 6   levocetirizine (XYZAL ) 5 MG tablet Take 1 tablet (5 mg total) by mouth every evening. (Patient not taking: Reported on 11/25/2023) 30 tablet 2   methimazole  (TAPAZOLE ) 5 MG tablet Take 1 tablet (5 mg total) by mouth daily. 90 tablet 3   Multiple Vitamin (MULTIVITAMIN WITH MINERALS) TABS tablet Take 1 tablet by mouth daily.     Current Facility-Administered Medications on File Prior to Visit  Medication Dose Route Frequency Provider Last Rate Last Admin   0.9 %  sodium chloride  infusion  500 mL Intravenous Continuous Nandigam, Kavitha V, MD       No Known Allergies Family History  Problem Relation Age of Onset   Hyperlipidemia Mother    Thyroid  disease Mother    Skin cancer Mother    Diabetes Maternal Grandmother    Healthy Father    Thyroid  disease Sister    Colon cancer Neg Hx    Colon polyps Neg Hx    Esophageal cancer Neg Hx    Rectal cancer Neg Hx    Stomach cancer Neg Hx    PE: BP 122/70   Pulse 78   Ht 5' 4 (1.626 m)   Wt 158 lb 9.6 oz (71.9 kg)   SpO2 98%   BMI 27.22 kg/m    Wt Readings from Last 3 Encounters:  11/05/24 158 lb 9.6 oz (71.9 kg)  11/22/22 158 lb (71.7 kg)  11/22/21 166 lb  3.2 oz (75.4 kg)   Constitutional: overweight, in NAD Eyes:  EOMI, no exophthalmos ENT: no neck masses, no cervical lymphadenopathy Cardiovascular: RRR, No MRG Respiratory: CTA B Musculoskeletal: no deformities Skin:no rashes Neurological: no tremor with outstretched hands  ASSESSMENT: 1. Graves disease  2. Goiter  PLAN:  1. Patient with persistent/recurrent Graves' disease, managed on methimazole  -She was previously on a higher dose of methimazole ,  but we were able to decrease the dose to 5 mg daily in 08/2019.  Afterwards, we had to increase the dose to 15 mg daily and then decrease to 5 mg twice a day in 08/2022.  In 10/2022, we decreased the dose to 5 mg daily as TSH was at the upper limit of the target range.  We continued this dose at our last visit in 11/2023. - She tolerates methimazole  well - We previously discussed about using RAI treatment, but she declined during the coronavirus pandemic and, after subsequent discussions, she mentioned that she was afraid of side effects.  She still prefers to manage her hyperthyroidism without RAI treatment despite a discussion about benefits of the procedure.  Her TFTs were normal in the last 2 years, so for now we can continue methimazole .  We discussed that continuing the low-dose methimazole  long-term is beneficial to prevent Graves' disease recurrences and has not been shown to have significant side effects.  We discussed about backing off the methimazole  dose or even stopping when the TSH increases in the 3-5 range. - Of note, her TSI's were elevated but normalized at last check in 2022. - At today's visit, she has no palpitations, unintentional weight loss, heat intolerance, anxiety.  Before the last 2 visits, she lost approximately 20 pounds through exercise, but at last visit she had knee pain and was not able to exercise as much.  At today's visit she mentions that she also was not able to exercise recently due to the cold weather.  She  is planning to restart. -At today's visit, we will repeat her TFTs and change the methimazole  dose accordingly -I plan to see her back in a year but possibly sooner for labs  2. Goiter - Continues to have a slightly enlarged thyroid , predominantly the left lobe - Neck compression symptoms - We will follow her expectantly for now  Orders Placed This Encounter  Procedures   TSH   T4, free   Needs refills.  Lela Fendt, MD PhD [facility] Endocrinology

## 2025-11-07 ENCOUNTER — Telehealth: Admitting: Internal Medicine
